# Patient Record
Sex: Male | Born: 2000
Health system: Southern US, Community
[De-identification: ages and names within clinical notes are randomized; demographics above are authoritative.]

## PROBLEM LIST (undated history)

## (undated) DIAGNOSIS — S8992XA Unspecified injury of left lower leg, initial encounter: Secondary | ICD-10-CM

## (undated) DIAGNOSIS — S93409A Sprain of unspecified ligament of unspecified ankle, initial encounter: Secondary | ICD-10-CM

## (undated) DIAGNOSIS — M9251 Juvenile osteochondrosis of tibia and fibula, right leg: Secondary | ICD-10-CM

## (undated) DIAGNOSIS — J101 Influenza due to other identified influenza virus with other respiratory manifestations: Secondary | ICD-10-CM

## (undated) DIAGNOSIS — Z8619 Personal history of other infectious and parasitic diseases: Secondary | ICD-10-CM

## (undated) DIAGNOSIS — Z8781 Personal history of (healed) traumatic fracture: Secondary | ICD-10-CM

## (undated) DIAGNOSIS — S83003A Unspecified subluxation of unspecified patella, initial encounter: Secondary | ICD-10-CM

## (undated) DIAGNOSIS — S060X9A Concussion with loss of consciousness of unspecified duration, initial encounter: Secondary | ICD-10-CM

## (undated) DIAGNOSIS — M92521 Juvenile osteochondrosis of tibia tubercle, right leg: Secondary | ICD-10-CM

## (undated) DIAGNOSIS — L300 Nummular dermatitis: Secondary | ICD-10-CM

## (undated) HISTORY — DX: Concussion with loss of consciousness of unspecified duration, initial encounter: S06.0X9A

## (undated) HISTORY — DX: Influenza due to other identified influenza virus with other respiratory manifestations: J10.1

## (undated) HISTORY — DX: Unspecified subluxation of unspecified patella, initial encounter: S83.003A

## (undated) HISTORY — DX: Nummular dermatitis: L30.0

## (undated) HISTORY — DX: Unspecified injury of left lower leg, initial encounter: S89.92XA

## (undated) HISTORY — DX: Personal history of (healed) traumatic fracture: Z87.81

## (undated) HISTORY — DX: Sprain of unspecified ligament of unspecified ankle, initial encounter: S93.409A

## (undated) HISTORY — DX: Personal history of other infectious and parasitic diseases: Z86.19

## (undated) HISTORY — DX: Rider (driver) (passenger) of other motorcycle injured in unspecified traffic accident, initial encounter: V29.99XA

---
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
## 2022-04-29 DIAGNOSIS — M25562 Pain in left knee: Secondary | ICD-10-CM | POA: Insufficient documentation

## 2022-04-30 ENCOUNTER — Ambulatory Visit: Payer: Commercial Managed Care - PPO | Admitting: Family Medicine

## 2022-05-01 ENCOUNTER — Encounter: Payer: Self-pay | Admitting: Family Medicine

## 2022-05-01 ENCOUNTER — Ambulatory Visit (INDEPENDENT_AMBULATORY_CARE_PROVIDER_SITE_OTHER): Payer: Commercial Managed Care - PPO | Admitting: Family Medicine

## 2022-05-01 VITALS — BP 110/74 | HR 91 | Temp 98.1°F | Ht 70.75 in | Wt 159.6 lb

## 2022-05-01 DIAGNOSIS — Z808 Family history of malignant neoplasm of other organs or systems: Secondary | ICD-10-CM

## 2022-05-01 DIAGNOSIS — R5383 Other fatigue: Secondary | ICD-10-CM | POA: Diagnosis not present

## 2022-05-01 DIAGNOSIS — F4323 Adjustment disorder with mixed anxiety and depressed mood: Secondary | ICD-10-CM | POA: Diagnosis not present

## 2022-05-01 LAB — CBC WITH DIFFERENTIAL/PLATELET
Basophils Absolute: 0 10*3/uL (ref 0.0–0.1)
Basophils Relative: 0.4 % (ref 0.0–3.0)
Eosinophils Absolute: 0.1 10*3/uL (ref 0.0–0.7)
Eosinophils Relative: 0.9 % (ref 0.0–5.0)
HCT: 44.8 % (ref 36.0–49.0)
Hemoglobin: 15.4 g/dL (ref 12.0–16.0)
Lymphocytes Relative: 14.4 % — ABNORMAL LOW (ref 24.0–48.0)
Lymphs Abs: 1.5 10*3/uL (ref 0.7–4.0)
MCHC: 34.2 g/dL (ref 31.0–37.0)
MCV: 90.3 fl (ref 78.0–98.0)
Monocytes Absolute: 1 10*3/uL (ref 0.1–1.0)
Monocytes Relative: 10 % (ref 3.0–12.0)
Neutro Abs: 7.7 10*3/uL (ref 1.4–7.7)
Neutrophils Relative %: 74.3 % — ABNORMAL HIGH (ref 43.0–71.0)
Platelets: 319 10*3/uL (ref 150.0–575.0)
RBC: 4.97 Mil/uL (ref 3.80–5.70)
RDW: 12.5 % (ref 11.4–15.5)
WBC: 10.4 10*3/uL (ref 4.5–13.5)

## 2022-05-01 LAB — COMPREHENSIVE METABOLIC PANEL
ALT: 10 U/L (ref 0–53)
AST: 16 U/L (ref 0–37)
Albumin: 4.6 g/dL (ref 3.5–5.2)
Alkaline Phosphatase: 74 U/L (ref 52–171)
BUN: 14 mg/dL (ref 6–23)
CO2: 29 mEq/L (ref 19–32)
Calcium: 10 mg/dL (ref 8.4–10.5)
Chloride: 99 mEq/L (ref 96–112)
Creatinine, Ser: 1.26 mg/dL (ref 0.40–1.50)
GFR: 83.02 mL/min (ref >60.00)
Glucose, Bld: 72 mg/dL (ref 70–99)
Potassium: 4.6 mEq/L (ref 3.5–5.1)
Sodium: 137 mEq/L (ref 135–145)
Total Bilirubin: 0.9 mg/dL (ref 0.3–1.2)
Total Protein: 7.6 g/dL (ref 6.0–8.3)

## 2022-05-01 LAB — PHOSPHORUS: Phosphorus: 3.9 mg/dL — ABNORMAL LOW (ref 4.5–5.5)

## 2022-05-01 LAB — TSH: TSH: 1.09 u[IU]/mL (ref 0.40–5.00)

## 2022-05-01 LAB — VITAMIN D 25 HYDROXY (VIT D DEFICIENCY, FRACTURES): VITD: 29.1 ng/mL — ABNORMAL LOW (ref 30.00–100.00)

## 2022-05-01 NOTE — Progress Notes (Signed)
OFFICE VISIT  05/02/2022  CC:  Chief Complaint  Patient presents with   Discuss family history    Wants to discuss family history of medical conditions; dad had cancer at 63 and paternal aunt developed cancer as well.    Patient is a 21 y.o. male who presents accompanied by his mom Celeste for concerns about cancer.  HPI: Bland Span has no acute medical complaints. He does have an acute left leg injury which he saw orthopedics for--he hyperextended his left knee when stepping off a ladder at work.  MRI was done today.  There is concern of a stress fracture and possibly ligament injury.  Bland Span is had a number of musculoskeletal injuries sustained in football and wrestling. All of these were with significant impact and or excessive mechanical stress on a bone or joint.  Bland Span has had some purposeful weight loss when trying to meet a specific weight when in wrestling. After these periods he is able to return to his base weight.  However after the most recent.  He feels like his appetite has not returned much.  He gets full easily.  He has no abdominal pain, though.  No nausea or vomiting. He seems to be tired more lately, less motivated to do things. All these things together, combined with family history of cancer brought up concerns that possibly Bland Span had cancer--- his father had thyroid cancer at age 58, his little sister died at a young age from glioblastoma multiforme.    I had a conversation today with Bland Span alone. He is struggling a lot with pressures of life, feels a bit lost and everything that he has to do and feels alone and like he is got no direction.  He admits to some periods of depression but they are mild and never lasting more than a day or 2.  He has good relationships with his friends although he does not see them as much due to being busy with school and work.  He has a good relationship with his mother and his brothers but says he does not really have a relationship with his  father. His biggest concern is that he does not feel motivated to do things like he used to.  He says this has affected his appetite as well. He is not using any alcohol or drugs.   Past Medical History:  Diagnosis Date   Acromioclavicular separation, type 1 01/2021   Concussion 06/2018 and 09/2018   Concussion clinic (Dr. Tamala Julian)   Grade 3 ankle sprain 04/2018   [facility]--Dr. Layne Benton.  +medial malleolus avulsion fracture with the sprain-->walking boot.   History of fracture of phalanx of finger    R   History of tinea corporis    pt is a wrestler   Influenza B 08/19/2018   [facility] in [place]   Nummular eczema 07/2018   Osgood-Schlatter's disease of right knee    Patellar subluxation    R   Tear of ulnar collateral ligament of right elbow 10/11/2020   (wrestling)->surgery (Dr. Caralyn Guile)    Past Surgical History:  Procedure Laterality Date   chest xray  05/29/2019   fl esophagram  05/29/2019    Outpatient Medications Prior to Visit  Medication Sig Dispense Refill   meloxicam (MOBIC) 15 MG tablet Take 15 mg by mouth daily.     cefdinir (OMNICEF) 300 MG capsule Take 1 capsule (300 mg total) by mouth 2 (two) times daily. (Patient not taking: Reported on 05/01/2022) 20 capsule 0  No facility-administered medications prior to visit.    Allergies  Allergen Reactions   Amoxicillin Rash    ROS As per HPI  PE:    05/01/2022   11:11 AM 12/25/2021    1:21 PM 09/14/2020    8:04 AM  Vitals with BMI  Height 5' 10.75" 5' 10.75"   Weight 159 lbs 10 oz 163 lbs 10 oz 158 lbs  BMI 60.63 01.60   Systolic 109 323   Diastolic 74 74   Pulse 91 114    Physical Exam  Gen: Alert, well appearing.  Patient is oriented to person, place, time, and situation. AFFECT: pleasant, lucid thought and speech. No further exam today.  LABS:  none  IMPRESSION AND PLAN:  #1 recurrent musculoskeletal injury. These have all been appropriate for the amount of trauma/impact  that caused them.  In other words, no worry about pathologic fracture. Check calcium, phosphorus, vitamin D, and alkaline phosphatase.  #2 family history of thyroid cancer in father and glioblastoma multiforme in sister. Check TSH today. Additionally, check CBC with differential and complete metabolic panel.  #3 adjustment disorder with mixed anxious and depressed mood. Gave emotional support, empathic listening today. We discussed medications for anxiety and depression and both of [place] felt like these were not imminently needed.  However I encouraged him to discuss this issue with his mother and we can reconvene to discuss medications or counseling as appropriate.  #4 fatigue and appetite loss. Suspect this is due to #3 above. Regarding his weight loss, it does seem that this has all been purposeful at different points in the past.  In review of his weights since he was 21 years old he has been 156 to 166 pounds, and is currently at 159-1/2 pounds.  Spent 30 min with pt today reviewing HPI, reviewing relevant past history, doing exam, reviewing and discussing lab and imaging data, and formulating plans.  An After Visit Summary was printed and given to the patient.  FOLLOW UP: Return for To be determined based on lab results.  Signed:  Crissie Sickles, MD           05/02/2022

## 2022-05-02 ENCOUNTER — Telehealth: Payer: Self-pay

## 2022-05-02 NOTE — Addendum Note (Signed)
Addended by: Emi Holes D on: 05/02/2022 11:47 AM   Modules accepted: Orders

## 2022-05-02 NOTE — Telephone Encounter (Signed)
Jeoffrey Massed, MD  05/02/2022  7:41 AM EDT Back to Top    Please cancel the alkaline phosphatase and basic metabolic panel that I ordered with these labs.   Both labs cancelled per provider request

## 2022-05-19 ENCOUNTER — Encounter: Payer: Self-pay | Admitting: Family Medicine

## 2022-05-21 LAB — CBC: RBC: 5.14 — AB (ref 3.87–5.11)

## 2022-05-21 LAB — CBC AND DIFFERENTIAL
HCT: 46 (ref 41–53)
Hemoglobin: 15.7 (ref 13.5–17.5)
Neutrophils Absolute: 6231
Platelets: 383 10*3/uL (ref 150–400)
WBC: 8.5

## 2022-05-21 LAB — COMPREHENSIVE METABOLIC PANEL
Albumin: 4.5 (ref 3.5–5.0)
Calcium: 9.9 (ref 8.7–10.7)
Globulin: 2.9
eGFR: 94

## 2022-05-21 LAB — POCT ERYTHROCYTE SEDIMENTATION RATE, NON-AUTOMATED: Sed Rate: 25

## 2022-05-21 LAB — HEPATIC FUNCTION PANEL
ALT: 10 U/L (ref 10–40)
AST: 14 (ref 14–40)
Alkaline Phosphatase: 78 (ref 25–125)
Bilirubin, Total: 0.7

## 2022-05-21 LAB — BASIC METABOLIC PANEL
BUN: 12 (ref 4–21)
CO2: 31 — AB (ref 13–22)
Chloride: 101 (ref 99–108)
Creatinine: 1.2 (ref 0.6–1.3)
Glucose: 110
Potassium: 4.5 mEq/L (ref 3.5–5.1)
Sodium: 138 (ref 137–147)

## 2022-05-21 LAB — VITAMIN D 25 HYDROXY (VIT D DEFICIENCY, FRACTURES): Vit D, 25-Hydroxy: 37

## 2022-05-21 LAB — TSH: TSH: 1.88 (ref 0.41–5.90)

## 2022-06-02 ENCOUNTER — Telehealth: Payer: Self-pay | Admitting: Family Medicine

## 2022-06-02 NOTE — Telephone Encounter (Signed)
Pt would like a phone call

## 2022-06-05 ENCOUNTER — Ambulatory Visit (INDEPENDENT_AMBULATORY_CARE_PROVIDER_SITE_OTHER): Payer: Commercial Managed Care - PPO | Admitting: Family Medicine

## 2022-06-05 ENCOUNTER — Encounter: Payer: Self-pay | Admitting: Family Medicine

## 2022-06-05 VITALS — BP 102/67 | HR 62 | Temp 98.1°F | Ht 70.75 in | Wt 159.0 lb

## 2022-06-05 DIAGNOSIS — M130 Polyarthritis, unspecified: Secondary | ICD-10-CM

## 2022-06-05 DIAGNOSIS — R5383 Other fatigue: Secondary | ICD-10-CM

## 2022-06-05 DIAGNOSIS — M25471 Effusion, right ankle: Secondary | ICD-10-CM | POA: Diagnosis not present

## 2022-06-05 DIAGNOSIS — M25472 Effusion, left ankle: Secondary | ICD-10-CM

## 2022-06-05 DIAGNOSIS — M25461 Effusion, right knee: Secondary | ICD-10-CM | POA: Diagnosis not present

## 2022-06-05 DIAGNOSIS — M25462 Effusion, left knee: Secondary | ICD-10-CM

## 2022-06-05 DIAGNOSIS — R700 Elevated erythrocyte sedimentation rate: Secondary | ICD-10-CM

## 2022-06-05 LAB — COMPREHENSIVE METABOLIC PANEL
ALT: 12 U/L (ref 0–53)
AST: 13 U/L (ref 0–37)
Albumin: 4.5 g/dL (ref 3.5–5.2)
Alkaline Phosphatase: 78 U/L (ref 52–171)
BUN: 13 mg/dL (ref 6–23)
CO2: 31 mEq/L (ref 19–32)
Calcium: 10.3 mg/dL (ref 8.4–10.5)
Chloride: 101 mEq/L (ref 96–112)
Creatinine, Ser: 1.08 mg/dL (ref 0.40–1.50)
GFR: 99.83 mL/min (ref >60.00)
Glucose, Bld: 62 mg/dL — ABNORMAL LOW (ref 70–99)
Potassium: 4.3 mEq/L (ref 3.5–5.1)
Sodium: 138 mEq/L (ref 135–145)
Total Bilirubin: 0.5 mg/dL (ref 0.2–1.2)
Total Protein: 8 g/dL (ref 6.0–8.3)

## 2022-06-05 LAB — CBC WITH DIFFERENTIAL/PLATELET
Basophils Absolute: 0.1 10*3/uL (ref 0.0–0.1)
Basophils Relative: 0.6 % (ref 0.0–3.0)
Eosinophils Absolute: 0 10*3/uL (ref 0.0–0.7)
Eosinophils Relative: 0.3 % (ref 0.0–5.0)
HCT: 43.2 % (ref 36.0–49.0)
Hemoglobin: 14.6 g/dL (ref 12.0–16.0)
Lymphocytes Relative: 13.6 % — ABNORMAL LOW (ref 24.0–48.0)
Lymphs Abs: 1.4 10*3/uL (ref 0.7–4.0)
MCHC: 33.8 g/dL (ref 31.0–37.0)
MCV: 90.6 fl (ref 78.0–98.0)
Monocytes Absolute: 0.8 10*3/uL (ref 0.1–1.0)
Monocytes Relative: 7.8 % (ref 3.0–12.0)
Neutro Abs: 8 10*3/uL — ABNORMAL HIGH (ref 1.4–7.7)
Neutrophils Relative %: 77.7 % — ABNORMAL HIGH (ref 43.0–71.0)
Platelets: 448 10*3/uL (ref 150.0–575.0)
RBC: 4.77 Mil/uL (ref 3.80–5.70)
RDW: 12.3 % (ref 11.4–15.5)
WBC: 10.3 10*3/uL (ref 4.5–13.5)

## 2022-06-05 LAB — MONONUCLEOSIS SCREEN: Mono Screen: NEGATIVE

## 2022-06-05 LAB — SEDIMENTATION RATE: Sed Rate: 50 mm/hr — ABNORMAL HIGH (ref 0–15)

## 2022-06-05 LAB — C-REACTIVE PROTEIN: CRP: 9.7 mg/dL (ref 0.5–20.0)

## 2022-06-05 MED ORDER — PREDNISONE 10 MG PO TABS: ORAL_TABLET | ORAL | 0 refills | Status: DC

## 2022-06-05 NOTE — Progress Notes (Signed)
OFFICE VISIT  06/05/2022  CC: joint swelling/pain  Patient is a 21 y.o. male who presents unaccompanied for joint concerns.  HPI: About 3 weeks ago he began having problems with his knees. Initially had a acute left knee injury at work and was being evaluated, MRI was done, I do not know the result but sounds like no acute tears/internal derangement.  This pain completely went away for a few days.  He then got spontaneous pain, swelling, and warmth in his right knee.  He was reevaluated by orthopedics and was found to have knee effusion and popliteal cyst.  These were aspirated.  I do not know if fluid was sent for analysis. He did have an extensive panel of blood tests around that time that I reviewed today, the only abnormality being a sed rate of 25 (CPK 83, uric acid 6.1 HLA-B27 negative, ANA negative, CCP negative, rheumatoid factor negative, ACE 24, TSH 1.88.  CBC and c-Met normal). We are not sure if he was put on any medication after this.  The right knee improved significantly but then within the last week his left knee has begun to swell, feel warm, and hurt. In addition both ankles hurt and feel swollen. He does feel moderately fatigued.  He does feel like he has had low-grade fevers sometimes at night. He hurts in the muscles of his thighs.  Right elbow hurting some lately--- he has had an ulnar collateral ligament tear and repair on this side.  Right elbow not swollen.  Denies headaches, neck stiffness or pain, back pain, hips pain, shoulders pain, or pain in the hands. No recent viral illness. No recent strep throat illness but he does have a history of recurrent strep throat through his teen years.  No known tick bite.  No known rash.  No GI symptoms.   Past Medical History:  Diagnosis Date   Acromioclavicular separation, type 1 01/2021   Concussion 06/2018 and 09/2018   Concussion clinic (Dr. Tamala Julian)   Grade 3 ankle sprain 04/2018   [facility]--Dr. Layne Benton.  +medial  malleolus avulsion fracture with the sprain-->walking boot.   History of fracture of phalanx of finger    R   History of tinea corporis    pt is a wrestler   Influenza B 08/19/2018   [place] [facility]   Left knee injury    effusion and popliteal cyst on MRI ([facility])->aspirated and injected 05/15/22   Nummular eczema 07/2018   Osgood-Schlatter's disease of right knee    Patellar subluxation    R   Tear of ulnar collateral ligament of right elbow 10/11/2020   (wrestling)->surgery (Dr. Caralyn Guile)    Past Surgical History:  Procedure Laterality Date   chest xray  05/29/2019   fl esophagram  05/29/2019    Outpatient Medications Prior to Visit  Medication Sig Dispense Refill   meloxicam (MOBIC) 15 MG tablet Take 15 mg by mouth daily.     No facility-administered medications prior to visit.    Allergies  Allergen Reactions   Amoxicillin Rash    ROS As per HPI  PE:    06/05/2022   11:03 AM 05/01/2022   11:11 AM 12/25/2021    1:21 PM  Vitals with BMI  Height 5' 10.75" 5' 10.75" 5' 10.75"  Weight 159 lbs 159 lbs 10 oz 163 lbs 10 oz  BMI 22.33 16.94 50.38  Systolic 882 800 349  Diastolic 67 74 74  Pulse 62 91 114  Physical Exam  Neuro: Alert and well-appearing. Cardiovascular: Regular rhythm and rate without murmur. Lungs are clear bilaterally, nonlabored breathing. No swelling, erythema, or warmth of the elbows, wrists, or hands. Right knee with minimal discomfort to palpation, no warmth, no erythema, visible effusion.  Popliteal area feels normal. Left knee with visible effusion, warmth, and moderate tenderness to palpation diffusely. He can extend the knee to a point just shy of full extension.  Flexion is fully intact. Ankles without obvious effusion or periarticular swelling on inspection.  No erythema or warmth.  Range of motion intact.  LABS:  Last CBC Lab Results  Component Value Date   WBC 10.4 05/01/2022   HGB 15.4 05/01/2022    HCT 44.8 05/01/2022   MCV 90.3 05/01/2022   RDW 12.5 05/01/2022   PLT 319.0 67/67/2094   Last metabolic panel Lab Results  Component Value Date   GLUCOSE 72 05/01/2022   NA 137 05/01/2022   K 4.6 05/01/2022   CL 99 05/01/2022   CO2 29 05/01/2022   BUN 14 05/01/2022   CREATININE 1.26 05/01/2022   CALCIUM 10.0 05/01/2022   PHOS 3.9 (L) 05/01/2022   PROT 7.6 05/01/2022   ALBUMIN 4.6 05/01/2022   BILITOT 0.9 05/01/2022   ALKPHOS 74 05/01/2022   AST 16 05/01/2022   ALT 10 05/01/2022   Last thyroid functions Lab Results  Component Value Date   TSH 1.09 05/01/2022   Last vitamin D Lab Results  Component Value Date   VD25OH 29.10 (L) 05/01/2022   IMPRESSION AND PLAN:  Polyarthritis. Unknown etiology.  Recent rheumatologic laboratory work at his orthopedist was unrevealing except sed rate of 25. We will have to check with their office to see if they did any joint fluid analysis from his right knee aspiration. I did blood work today-->Lyme serology with reflex Western blot, ASO titer, CRP, and repeat of his CBC, c-Met and sed rate. I did start prednisone today: 30 mg a day x3 days, 20 mg a day x3 days, then 10 mg a day x3 days. Discussed possible aspiration of left knee today but since he is not in all that much discomfort with it we chose to hold off.  We can always have him come back if we need to aspirate for synovial fluid analysis.  He does have a rheumatology initial visit set for sometime in November.  He did not have details of exact date or who he is seeing.  (Bedside limited [facility] ultrasound today: Left knee with moderate effusion in suprapatellar pouch as well as along medial and lateral peripatellar joint spaces.  Anechoic changes surrounding popliteal tendon.  Medial and lateral menisci normal.  Quad and patellar tendons appear normal.  There is some hyperemia in suprapatellar pouch region indicating possible synovitis. Popliteal region with anechoic changes in  semimembranosus-medial gastroc bursa with indistinct borders, suggesting possible recent rupture.  Right knee with just a small suprapatellar effusion.  No hyperemia.  Both ankles show small amount of anechoic distention of the intra-articular fat of the tibiotalar joint.)  Spent 50 min with pt today reviewing HPI, specialist notes and labs, reviewing relevant past history, doing exam, performing bedside ultrasound, discussing further evaluation and treatment options, and formulating plans.  An After Visit Summary was printed and given to the patient.  FOLLOW UP: Return in about 1 week (around 06/12/2022) for Follow-up arthritis.  Signed:  Crissie Sickles, MD           06/05/2022

## 2022-06-06 LAB — LYME DISEASE SEROLOGY W/REFLEX: Lyme Total Antibody EIA: NEGATIVE

## 2022-06-06 LAB — ANTISTREPTOLYSIN O TITER: ASO: 113 IU/mL (ref <250)

## 2022-06-09 ENCOUNTER — Telehealth: Payer: Self-pay

## 2022-06-09 NOTE — Telephone Encounter (Signed)
Please advise if call back can be completed.

## 2022-06-09 NOTE — Telephone Encounter (Signed)
Patient Mom, [facility]) would like to discuss test results, sed rate, with Dr. Anitra Lauth.  She states she does not think Bland Span is taking prednisone.  With all labs recently, she is wondering what Dr. Anitra Lauth is trying to "rule out". She is requesting quick chat with him after hours is okay. [phone]

## 2022-06-09 NOTE — Telephone Encounter (Signed)
Called patient and left message on his voicemail asking him to call back to discuss results.  Called and talked to his mother. She is not sure if he started the prednisone. She is going to talk with Bland Span and see if he will come back in for aspiration of his knee and analysis of the fluid.

## 2022-06-23 ENCOUNTER — Emergency Department (HOSPITAL_BASED_OUTPATIENT_CLINIC_OR_DEPARTMENT_OTHER)
Admission: EM | Admit: 2022-06-23 | Discharge: 2022-06-24 | Disposition: A | Discharge status: Home / Self Care | Payer: Commercial Managed Care - PPO | Attending: Emergency Medicine | Admitting: Emergency Medicine

## 2022-06-23 ENCOUNTER — Other Ambulatory Visit: Payer: Self-pay

## 2022-06-23 ENCOUNTER — Encounter (HOSPITAL_BASED_OUTPATIENT_CLINIC_OR_DEPARTMENT_OTHER): Payer: Self-pay | Admitting: Emergency Medicine

## 2022-06-23 DIAGNOSIS — B349 Viral infection, unspecified: Secondary | ICD-10-CM | POA: Diagnosis not present

## 2022-06-23 DIAGNOSIS — Z20822 Contact with and (suspected) exposure to covid-19: Secondary | ICD-10-CM | POA: Insufficient documentation

## 2022-06-23 DIAGNOSIS — D72829 Elevated white blood cell count, unspecified: Secondary | ICD-10-CM | POA: Insufficient documentation

## 2022-06-23 DIAGNOSIS — R509 Fever, unspecified: Secondary | ICD-10-CM | POA: Diagnosis present

## 2022-06-23 NOTE — ED Triage Notes (Signed)
Patient arrived via POV c/o fever w/ sore throat x 3 days. Patient states headache w/ light sensitivity. Patient denies being around anyone else sick. Patient is AO x 4, VS w/ fever, slow gait.

## 2022-06-24 ENCOUNTER — Encounter (HOSPITAL_BASED_OUTPATIENT_CLINIC_OR_DEPARTMENT_OTHER): Payer: Self-pay | Admitting: Emergency Medicine

## 2022-06-24 LAB — CBC WITH DIFFERENTIAL/PLATELET
Abs Immature Granulocytes: 0.06 10*3/uL (ref 0.00–0.07)
Basophils Absolute: 0 10*3/uL (ref 0.0–0.1)
Basophils Relative: 0 %
Eosinophils Absolute: 0 10*3/uL (ref 0.0–0.5)
Eosinophils Relative: 0 %
HCT: 41.5 % (ref 39.0–52.0)
Hemoglobin: 14.2 g/dL (ref 13.0–17.0)
Immature Granulocytes: 0 %
Lymphocytes Relative: 4 %
Lymphs Abs: 0.7 10*3/uL (ref 0.7–4.0)
MCH: 30.3 pg (ref 26.0–34.0)
MCHC: 34.2 g/dL (ref 30.0–36.0)
MCV: 88.5 fL (ref 80.0–100.0)
Monocytes Absolute: 0.8 10*3/uL (ref 0.1–1.0)
Monocytes Relative: 5 %
Neutro Abs: 15.5 10*3/uL — ABNORMAL HIGH (ref 1.7–7.7)
Neutrophils Relative %: 91 %
Platelets: 274 10*3/uL (ref 150–400)
RBC: 4.69 MIL/uL (ref 4.22–5.81)
RDW: 12.5 % (ref 11.5–15.5)
WBC: 17.1 10*3/uL — ABNORMAL HIGH (ref 4.0–10.5)
nRBC: 0 % (ref 0.0–0.2)

## 2022-06-24 LAB — COMPREHENSIVE METABOLIC PANEL
ALT: 12 U/L (ref 0–44)
AST: 18 U/L (ref 15–41)
Albumin: 4 g/dL (ref 3.5–5.0)
Alkaline Phosphatase: 66 U/L (ref 38–126)
Anion gap: 8 (ref 5–15)
BUN: 11 mg/dL (ref 6–20)
CO2: 23 mmol/L (ref 22–32)
Calcium: 8.9 mg/dL (ref 8.9–10.3)
Chloride: 102 mmol/L (ref 98–111)
Creatinine, Ser: 1.07 mg/dL (ref 0.61–1.24)
GFR, Estimated: >60 mL/min (ref >60)
Glucose, Bld: 101 mg/dL — ABNORMAL HIGH (ref 70–99)
Potassium: 3.4 mmol/L — ABNORMAL LOW (ref 3.5–5.1)
Sodium: 133 mmol/L — ABNORMAL LOW (ref 135–145)
Total Bilirubin: 1.1 mg/dL (ref 0.3–1.2)
Total Protein: 7.5 g/dL (ref 6.5–8.1)

## 2022-06-24 LAB — MONONUCLEOSIS SCREEN: Mono Screen: NEGATIVE

## 2022-06-24 LAB — RESP PANEL BY RT-PCR (FLU A&B, COVID) ARPGX2
Influenza A by PCR: NEGATIVE
Influenza B by PCR: NEGATIVE
SARS Coronavirus 2 by RT PCR: NEGATIVE

## 2022-06-24 LAB — GROUP A STREP BY PCR: Group A Strep by PCR: NOT DETECTED

## 2022-06-24 MED ORDER — DIPHENHYDRAMINE HCL 50 MG/ML IJ SOLN
25.0000 mg | Freq: Once | INTRAMUSCULAR | Status: AC
  Administered 2022-06-24: 25 mg via INTRAVENOUS
  Filled 2022-06-24: qty 1

## 2022-06-24 MED ORDER — METOCLOPRAMIDE HCL 5 MG/ML IJ SOLN
5.0000 mg | Freq: Once | INTRAMUSCULAR | Status: AC
  Administered 2022-06-24: 5 mg via INTRAVENOUS
  Filled 2022-06-24: qty 2

## 2022-06-24 MED ORDER — ACETAMINOPHEN 325 MG PO TABS: 650.0000 mg | ORAL_TABLET | Freq: Four times a day (QID) | ORAL | 0 refills | Status: DC | PRN

## 2022-06-24 MED ORDER — LIDOCAINE 5 % EX PTCH
1.0000 | MEDICATED_PATCH | CUTANEOUS | Status: DC
  Administered 2022-06-24: 1 via TRANSDERMAL
  Filled 2022-06-24: qty 1

## 2022-06-24 MED ORDER — METOCLOPRAMIDE HCL 10 MG PO TABS: 5.0000 mg | ORAL_TABLET | Freq: Once | ORAL | Status: DC

## 2022-06-24 MED ORDER — DIPHENHYDRAMINE HCL 25 MG PO CAPS: 25.0000 mg | ORAL_CAPSULE | Freq: Once | ORAL | Status: DC

## 2022-06-24 MED ORDER — LIDOCAINE VISCOUS HCL 2 % MT SOLN: 15.0000 mL | Freq: Once | OROMUCOSAL | Status: DC

## 2022-06-24 MED ORDER — IBUPROFEN 600 MG PO TABS: 600.0000 mg | ORAL_TABLET | Freq: Four times a day (QID) | ORAL | 0 refills | Status: DC | PRN

## 2022-06-24 MED ORDER — IBUPROFEN 800 MG PO TABS
800.0000 mg | ORAL_TABLET | Freq: Once | ORAL | Status: AC
  Administered 2022-06-24: 800 mg via ORAL
  Filled 2022-06-24: qty 1

## 2022-06-24 MED ORDER — SODIUM CHLORIDE 0.9 % IV BOLUS
1000.0000 mL | Freq: Once | INTRAVENOUS | Status: AC
  Administered 2022-06-24: 1000 mL via INTRAVENOUS

## 2022-06-24 MED ORDER — METOCLOPRAMIDE HCL 5 MG PO TABS: 5.0000 mg | ORAL_TABLET | Freq: Four times a day (QID) | ORAL | 0 refills | Status: DC | PRN

## 2022-06-24 MED ORDER — MENTHOL 3 MG MT LOZG: 1.0000 | LOZENGE | OROMUCOSAL | 0 refills | Status: DC | PRN

## 2022-06-24 MED ORDER — KETOROLAC TROMETHAMINE 15 MG/ML IJ SOLN
15.0000 mg | Freq: Once | INTRAMUSCULAR | Status: AC
  Administered 2022-06-24: 15 mg via INTRAVENOUS
  Filled 2022-06-24: qty 1

## 2022-06-24 NOTE — ED Provider Notes (Signed)
[facility] EMERGENCY DEPARTMENT Provider Note   CSN: [id number] Arrival date & time: 06/23/22  2302     History  Chief Complaint  Patient presents with   Fever    William Paul is a 21 y.o. male.  Patient as above with significant medical history as below, including osgood schlatter, prior concussion, msk injuries as listed below who presents to the ED with complaint of headache, sore throat, fever.  Patient ports sore throat for the past 2 to 3 days, been having headache this evening.  Fevers and chills last few hours, mother took his temperature at home with ear thermometer temperature was 102.  She gave him 1000mg  of Tylenol around 1130 12 AM.  Patient reports headache is primarily behind his eyes on both sides, he has some light sensitivity, he has body aches, no  chills, no numbness or tingling.  No neck stiffness, no rashes, no IV drug use, no recent travel or sick contacts.  Headache described as sharp, pulsatile.     Past Medical History:  Diagnosis Date   Acromioclavicular separation, type 1 01/2021   Concussion 06/2018 and 09/2018   Concussion clinic (Dr. 10/2018)   Grade 3 ankle sprain 04/2018   [facility]--Dr. 05/2018.  +medial malleolus avulsion fracture with the sprain-->walking boot.   History of fracture of phalanx of finger    R   History of tinea corporis    pt is a wrestler   Influenza B 08/19/2018   [facility]   Left knee injury    effusion and popliteal cyst on MRI (em[facility])->aspirated and injected 05/15/22   Nummular eczema 07/2018   Osgood-Schlatter's disease of right knee    Patellar subluxation    R   Tear of ulnar collateral ligament of right elbow 10/11/2020   (wrestling)->surgery (Dr. 12/09/2020)    Past Surgical History:  Procedure Laterality Date   chest xray  05/29/2019   fl esophagram  05/29/2019      The history is provided by the patient. No language interpreter was used.  Fever Associated  symptoms: chills, headaches and sore throat   Associated symptoms: no chest pain, no confusion, no cough, no nausea, no rash and no vomiting        Home Medications Prior to Admission medications   Medication Sig Start Date End Date Taking? Authorizing Provider  acetaminophen (TYLENOL) 325 MG tablet Take 2 tablets (650 mg total) by mouth every 6 (six) hours as needed. 06/24/22  Yes 06/26/22 A, DO  ibuprofen (ADVIL) 600 MG tablet Take 1 tablet (600 mg total) by mouth every 6 (six) hours as needed. 06/24/22  Yes 06/26/22, DO  menthol-cetylpyridinium (CEPACOL) 3 MG lozenge Take 1 lozenge (3 mg total) by mouth as needed for sore throat. 06/24/22  Yes 06/26/22, DO  metoCLOPramide (REGLAN) 5 MG tablet Take 1 tablet (5 mg total) by mouth every 6 (six) hours as needed (headache). 06/24/22  Yes 06/26/22 A, DO  meloxicam (MOBIC) 15 MG tablet Take 15 mg by mouth daily. 04/29/22   [provider]  predniSONE (DELTASONE) 10 MG tablet 3 tabs po qd x 3d, then 2 tabs po qd x 3d, then 1 tab po qd x 3d 06/05/22   McGowen, 08/05/22, MD      Allergies    Amoxicillin    Review of Systems   Review of Systems  Constitutional:  Positive for chills and fever.  HENT:  Positive for sore  throat. Negative for facial swelling and trouble swallowing.   Eyes:  Negative for photophobia and visual disturbance.  Respiratory:  Negative for cough and shortness of breath.   Cardiovascular:  Negative for chest pain and palpitations.  Gastrointestinal:  Negative for abdominal pain, nausea and vomiting.  Endocrine: Negative for polydipsia and polyuria.  Genitourinary:  Negative for difficulty urinating and hematuria.  Musculoskeletal:  Positive for arthralgias. Negative for gait problem and joint swelling.  Skin:  Negative for pallor and rash.  Neurological:  Positive for headaches. Negative for syncope.  Psychiatric/Behavioral:  Negative for agitation and confusion.     Physical Exam Updated  Vital Signs BP 128/71 (BP Location: Left Arm)   Pulse 79   Temp 98.5 F (36.9 C) (Oral)   Resp 20   Ht 5\' 11"  (1.803 m)   Wt 72.6 kg   SpO2 98%   BMI 22.32 kg/m  Physical Exam Vitals and nursing note reviewed.  Constitutional:      General: He is not in acute distress.    Appearance: Normal appearance. He is well-developed. He is not ill-appearing, toxic-appearing or diaphoretic.  HENT:     Head: Normocephalic and atraumatic.     Right Ear: External ear normal.     Left Ear: External ear normal.     Mouth/Throat:     Mouth: Mucous membranes are moist.     Tongue: Tongue does not deviate from midline.     Pharynx: Uvula midline. Pharyngeal swelling present. No uvula swelling.     Tonsils: No tonsillar exudate or tonsillar abscesses.     Comments: No drooling stridor or trismus Eyes:     General: No scleral icterus.       Right eye: No discharge.        Left eye: No discharge.     Extraocular Movements: Extraocular movements intact.     Pupils: Pupils are equal, round, and reactive to light.  Neck:     Trachea: Trachea normal.     Meningeal: Brudzinski's sign and Kernig's sign absent.  Cardiovascular:     Rate and Rhythm: Normal rate and regular rhythm.     Pulses: Normal pulses.     Heart sounds: Normal heart sounds.  Pulmonary:     Effort: Pulmonary effort is normal. No respiratory distress.     Breath sounds: Normal breath sounds.  Abdominal:     General: Abdomen is flat.     Palpations: Abdomen is soft.     Tenderness: There is no abdominal tenderness. There is no guarding or rebound.  Musculoskeletal:        General: Normal range of motion.     Cervical back: Full passive range of motion without pain and normal range of motion. No rigidity. No pain with movement. Normal range of motion.     Right lower leg: No edema.     Left lower leg: No edema.  Skin:    General: Skin is warm and dry.     Capillary Refill: Capillary refill takes less than 2 seconds.   Neurological:     Mental Status: He is alert and oriented to person, place, and time.     GCS: GCS eye subscore is 4. GCS verbal subscore is 5. GCS motor subscore is 6.     Cranial Nerves: Cranial nerves 2-12 are intact. No dysarthria or facial asymmetry.     Sensory: Sensation is intact.     Motor: Motor function is intact. No weakness.  Coordination: Coordination is intact.     Gait: Gait is intact.  Psychiatric:        Mood and Affect: Mood normal.        Behavior: Behavior normal.     ED Results / Procedures / Treatments   Labs (all labs ordered are listed, but only abnormal results are displayed) Labs Reviewed  CBC WITH DIFFERENTIAL/PLATELET - Abnormal; Notable for the following components:      Result Value   WBC 17.1 (*)    Neutro Abs 15.5 (*)    All other components within normal limits  COMPREHENSIVE METABOLIC PANEL - Abnormal; Notable for the following components:   Sodium 133 (*)    Potassium 3.4 (*)    Glucose, Bld 101 (*)    All other components within normal limits  GROUP A STREP BY PCR  RESP PANEL BY RT-PCR (FLU A&B, COVID) [id number]  SARS CORONAVIRUS 2 BY RT PCR  MONONUCLEOSIS SCREEN    EKG None  Radiology No results found.  Procedures Procedures    Medications Ordered in ED Medications  lidocaine (LIDODERM) 5 % 1 patch (1 patch Transdermal Patch Applied 06/24/22 0135)  lidocaine (XYLOCAINE) 2 % viscous mouth solution 15 mL (0 mLs Mouth/Throat Hold 06/24/22 0130)  ibuprofen (ADVIL) tablet 800 mg (800 mg Oral Given 06/24/22 0142)  sodium chloride 0.9 % bolus 1,000 mL (0 mLs Intravenous Stopped 06/24/22 0219)  metoCLOPramide (REGLAN) injection 5 mg (5 mg Intravenous Given 06/24/22 0137)  diphenhydrAMINE (BENADRYL) injection 25 mg (25 mg Intravenous Given 06/24/22 0137)  ketorolac (TORADOL) 15 MG/ML injection 15 mg (15 mg Intravenous Given 06/24/22 0234)    ED Course/ Medical Decision Making/ A&P Clinical Course as of 06/24/22 0325  Tue Jun 24, 2022  0058 Pt's mother came out into nursing area, reports that patient would now like iv/iv medications; will adjust orders  [SG]    Clinical Course User Index [SG] Sloan Leiter, DO                           Medical Decision Making Amount and/or Complexity of Data Reviewed Labs: ordered.  Risk OTC drugs. Prescription drug management.   This patient presents to the ED with chief complaint(s) of headache, sore throat with pertinent past medical history of above which further complicates the presenting complaint. The complaint involves an extensive differential diagnosis and also carries with it a high risk of complications and morbidity.    Differential diagnosis includes but is not exclusive to subarachnoid hemorrhage, meningitis, encephalitis, previous head trauma, cavernous venous thrombosis, muscle tension headache, glaucoma, temporal arteritis, migraine or migraine equivalent, strep, COVID, influenza, mono, viral syndrome etc.  . Serious etiologies were considered.   The initial plan is to RVP, strep in triage >> I discussed obtaining labs, IV medications with the patient; he is refusing any IV medications, IV fluids, or labs at this time.  Will trial p.o. medications.  Exam is reassuring.  AOx3, GCS 15, no focal neurologic deficits.  HDS.   Additional history obtained: Additional history obtained from family Records reviewed Primary Care Documents home medications, prior labs and imaging  Independent labs interpretation:  The following labs were independently interpreted:  Strep negative, RVP negative, mono negative CMP stable, CBC with leukocytosis 17.1; patient was febrile prior to arrival, vital signs stable otherwise.  Nontoxic in appearance  Independent visualization of imaging: N/a  Pulse oximetry  Treatment and Reassessment: Reglan, Benadryl, lidocaine, viscous lidocaine,ivf, toradol >>  symptoms greatly improved, pt sitting at edge of bed asking to go home    Consultation: - Consulted or discussed management/test interpretation w/ external professional: na  Consideration for admission or further workup: Admission was considered   Patient with likely viral syndrome, RVP, strep and mono were all negative today.  I have low suspicion for meningitis, he has no meningismus, no nuchal rigidity, no kernig/brudzinski, neurologically is intact, he is speaking clearly in full sentences, not altered, he is not septic or toxic in appearance.  He has no rashes.  Again no meningismus.  No recent travel or sick contacts. Does have leukocytosis, favor this is likely due to a viral infection.  Symptoms have greatly improved following intervention in the ED.  Discussed very strict return precautions with patient and family (mother)  The patient improved significantly and was discharged in stable condition. Detailed discussions were had with the patient regarding current findings, and need for close f/u with PCP or on call doctor. The patient has been instructed to return immediately if the symptoms worsen in any way for re-evaluation. Patient verbalized understanding and is in agreement with current care plan. All questions answered prior to discharge. Stressed importance of pcp follow up. Advised to RTED if symptoms worsen in any way or worrisome symptoms develop of if he does not improve over the next few days.     Social Determinants of health: Social History   Tobacco Use   Smoking status: Never   Smokeless tobacco: Never  Vaping Use   Vaping Use: Never used  Substance Use Topics   Alcohol use: No   Drug use: No            Final Clinical Impression(s) / ED Diagnoses Final diagnoses:  Viral illness    Rx / DC Orders ED Discharge Orders          Ordered    metoCLOPramide (REGLAN) 5 MG tablet  Every 6 hours PRN        06/24/22 0314    acetaminophen (TYLENOL) 325 MG tablet  Every 6 hours PRN        06/24/22 0314    ibuprofen (ADVIL) 600 MG  tablet  Every 6 hours PRN        06/24/22 0314    menthol-cetylpyridinium (CEPACOL) 3 MG lozenge  As needed        06/24/22 0316              Jeanell Sparrow, DO 06/24/22 [phone]

## 2022-06-24 NOTE — Discharge Instructions (Addendum)
It was a pleasure caring for you today in the emergency department.  Recommend lots of fluids and rest over the next few days  If your symptoms worsen please return to the ED, specifically if you pass out, start acting abnormally/speaking abnormally, develop a rash, headache becomes severe, difficulty turning/moving your neck, any worsening or worrisome symptoms

## 2022-06-25 ENCOUNTER — Telehealth: Payer: Self-pay

## 2022-06-25 ENCOUNTER — Ambulatory Visit (INDEPENDENT_AMBULATORY_CARE_PROVIDER_SITE_OTHER): Payer: Commercial Managed Care - PPO | Admitting: Family Medicine

## 2022-06-25 ENCOUNTER — Encounter: Payer: Self-pay | Admitting: Family Medicine

## 2022-06-25 ENCOUNTER — Telehealth: Payer: Self-pay | Admitting: Family Medicine

## 2022-06-25 VITALS — BP 147/80 | HR 79 | Temp 98.2°F | Wt 159.4 lb

## 2022-06-25 DIAGNOSIS — B9689 Other specified bacterial agents as the cause of diseases classified elsewhere: Secondary | ICD-10-CM

## 2022-06-25 DIAGNOSIS — J069 Acute upper respiratory infection, unspecified: Secondary | ICD-10-CM

## 2022-06-25 DIAGNOSIS — J029 Acute pharyngitis, unspecified: Secondary | ICD-10-CM | POA: Diagnosis not present

## 2022-06-25 LAB — POCT RAPID STREP A (OFFICE): Rapid Strep A Screen: NEGATIVE

## 2022-06-25 MED ORDER — DOXYCYCLINE HYCLATE 100 MG PO TABS: 100.0000 mg | ORAL_TABLET | Freq: Two times a day (BID) | ORAL | 0 refills | Status: DC

## 2022-06-25 NOTE — Progress Notes (Signed)
William Paul , May 15, 2001, 21 y.o., male MRN: [id number] Patient Care Team    Relationship Specialty Notifications Start End  McGowen, Maryjean Morn, MD PCP - General Family Medicine  03/14/15   Judi Saa, DO Consulting Physician Sports Medicine  06/23/18   Cherie Dark, PA Physician Assistant Orthopedic Surgery  11/14/19    Comment: Tivis Ringer, MD Consulting Physician Orthopedic Surgery  10/16/20   Arlyce Harman, MD Consulting Physician Sports Medicine  05/19/22     Chief Complaint  Patient presents with   Sore Throat    Started Saturday negative rapid and COVID on 10/23     Subjective: Pt presents for an OV with complaints of continued sore throat.  Patient reports she was seen in the urgent care a few days ago with pharyngitis.  He reports his fever is improving with Tylenol use but is still present.  He denies nausea, vomiting or diarrhea.  He is tolerating p.o. Reviewed labs that showed mild hyponatremia and mild low potassium in ED.  He has been hydrating with electrolyte drinks. His white blood cell count was significantly elevated at 17.1 with neutrophils 15.5. Negative monoscreen, negative group a strep by PCR, negative influenza, negative COVID, again and negative mono on 10/5. Patient states he appreciates white spots on the back of his tonsils now and he has had strep throat multiple times over the last few years that have been culture proven.     05/01/2022   11:15 AM 12/25/2021    1:27 PM  Depression screen PHQ 2/9  Decreased Interest 1 0  Down, Depressed, Hopeless 0 0  PHQ - 2 Score 1 0    Allergies  Allergen Reactions   Amoxicillin Rash   Social History   Social History Narrative   Parents divorced, lives part time with each.   Has 3 brothers.   Home-schooled.   No tobacco exposure.   Past Medical History:  Diagnosis Date   Acromioclavicular separation, type 1 01/2021   Concussion 06/2018 and 09/2018   Concussion clinic  (Dr. Katrinka Blazing)   Grade 3 ankle sprain 04/2018   [facility]--Dr. Cleophas Dunker.  +medial malleolus avulsion fracture with the sprain-->walking boot.   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 Melvyn Novas)   Past Surgical History:  Procedure Laterality Date   chest xray  05/29/2019   fl esophagram  05/29/2019   Family History  Problem Relation Age of Onset   Thyroid cancer Father 32   Brain cancer Sister    Mental illness Paternal Uncle    Mental illness Paternal Grandmother    Allergies as of 06/25/2022       Reactions   Amoxicillin Rash        Medication List        Accurate as of June 25, 2022  4:57 PM. If you have any questions, ask your nurse or doctor.          acetaminophen 325 MG tablet Commonly known as: Tylenol  Take 2 tablets (650 mg total) by mouth every 6 (six) hours as needed.   doxycycline 100 MG tablet Commonly known as: VIBRA-TABS Take 1 tablet (100 mg total) by mouth 2 (two) times daily. Started by: Howard Pouch, DO   ibuprofen 600 MG tablet Commonly known as: ADVIL Take 1 tablet (600 mg total) by mouth every 6 (six) hours as needed.   meloxicam 15 MG tablet Commonly known as: MOBIC Take 15 mg by mouth daily.   menthol-cetylpyridinium 3 MG lozenge Commonly known as: CEPACOL Take 1 lozenge (3 mg total) by mouth as needed for sore throat.   metoCLOPramide 5 MG tablet Commonly known as: REGLAN Take 1 tablet (5 mg total) by mouth every 6 (six) hours as needed (headache).   predniSONE 10 MG tablet Commonly known as: DELTASONE 3 tabs po qd x  3d, then 2 tabs po qd x 3d, then 1 tab po qd x 3d        All past medical history, surgical history, allergies, family history, immunizations andmedications were updated in the EMR today and reviewed under the history and medication portions of their EMR.     ROS Negative, with the exception of above mentioned in HPI   Objective:  BP (!) 147/80   Pulse 79   Temp 98.2 F (36.8 C)   Wt 159 lb 6.4 oz (72.3 kg)   SpO2 99%   BMI 22.23 kg/m  Body mass index is 22.23 kg/m. Physical Exam Vitals and nursing note reviewed.  Constitutional:      General: He is not in acute distress.    Appearance: Normal appearance. He is not ill-appearing, toxic-appearing or diaphoretic.  HENT:     Head: Normocephalic and atraumatic.     Right Ear: Tympanic membrane, ear canal and external ear normal. There is no impacted cerumen.     Left Ear: Tympanic membrane, ear canal and external ear normal. There is no impacted cerumen.     Nose: Nose normal. No congestion or rhinorrhea.     Mouth/Throat:     Mouth: Mucous membranes are moist.     Pharynx: Posterior oropharyngeal erythema present.     Tonsils: Tonsillar exudate present. No tonsillar abscesses. 1+ on the right. 1+ on the left.  Eyes:     General: No scleral icterus.       Right eye: No discharge.        Left eye: No discharge.     Extraocular Movements: Extraocular movements intact.     Pupils: Pupils are equal, round, and reactive to light.  Cardiovascular:     Rate and Rhythm: Normal rate and regular rhythm.     Heart sounds: No murmur heard. Pulmonary:     Effort: Pulmonary effort is normal.     Breath sounds: Normal breath sounds. No wheezing or rales.  Musculoskeletal:     Cervical back: Normal range of motion and neck supple. Tenderness present. No rigidity.  Lymphadenopathy:     Cervical: Cervical adenopathy present.  Skin:    General: Skin is warm and dry.     Coloration: Skin is not jaundiced or pale.     Findings: No  rash.  Neurological:     Mental Status: He is alert and oriented to person, place, and time. Mental status is at baseline.  Psychiatric:        Mood and Affect: Mood normal.        Behavior: Behavior normal.        Thought  Content: Thought content normal.        Judgment: Judgment normal.      No results found. No results found. Results for orders placed or performed in visit on 06/25/22 (from the past 24 hour(s))  POCT rapid strep A     Status: None   Collection Time: 06/25/22  3:47 PM  Result Value Ref Range   Rapid Strep A Screen Negative Negative    Assessment/Plan: Lelend Heinecke is a 21 y.o. male present for OV for  Pharyngitis, unspecified etiology/bacterial respiratory infection Symptoms have been present for greater than 7 days, with worsening sore throat and fever.  Fever is responding to Tylenol.  He is tolerating p.o.  I do appreciate some white exudates on the back of his right tonsil today. Elect to treat with doxycycline twice daily x10 days Patient encouraged to gargle with salt water nightly - POCT rapid strep A-negative - Upper Respiratory Culture sent respiratory throat culture for further evaluation Follow-up in 2 weeks with PCP if symptoms are not improving sooner if worsening   Reviewed expectations re: course of current medical issues. Discussed self-management of symptoms. Outlined signs and symptoms indicating need for more acute intervention. Patient verbalized understanding and all questions were answered. Patient received an After-Visit Summary.    Orders Placed This Encounter  Procedures   Upper Respiratory Culture   POCT rapid strep A   Meds ordered this encounter  Medications   doxycycline (VIBRA-TABS) 100 MG tablet    Sig: Take 1 tablet (100 mg total) by mouth 2 (two) times daily.    Dispense:  20 tablet    Refill:  0   Referral Orders  No referral(s) requested today     Note is dictated utilizing voice recognition software.  Although note has been proof read prior to signing, occasional typographical errors still can be missed. If any questions arise, please do not hesitate to call for verification.   electronically signed by:  Felix Pacini, DO  [facility] [place]

## 2022-06-25 NOTE — Telephone Encounter (Signed)
Pts mom called stating that her son has more white spots and that he went to the emergency room and the fast strp test came back negative, however she is requesting that Dr. Anitra Lauth order the lab or long test. She is also wondering if he should come in for an appt. Please reach out to the patient.

## 2022-06-25 NOTE — Telephone Encounter (Addendum)
Transition Care Management Follow-up Telephone Call Date of discharge and from where: 06/24/22 [facility] How have you been since you were released from the hospital? no Any questions or concerns? Yes  Items Reviewed: Did the pt receive and understand the discharge instructions provided? Yes  Medications obtained and verified? No  Other? No  Any new allergies since your discharge? No  Dietary orders reviewed? Yes Do you have support at home? Yes   Home Care and Equipment/Supplies: Were home health services ordered? not applicable If so, what is the name of the agency? N/a  Has the agency set up a time to come to the patient's home? not applicable Were any new equipment or medical supplies ordered?  No What is the name of the medical supply agency? N/a Were you able to get the supplies/equipment? not applicable Do you have any questions related to the use of the equipment or supplies? No  Functional Questionnaire: (I = Independent and D = Dependent) ADLs: I  Bathing/Dressing- I  Meal Prep- I  Eating- I  Maintaining continence- I  Transferring/Ambulation- I  Managing Meds- I  Follow up appointments reviewed:  PCP Hospital f/u appt confirmed?  Pt seeing other provider in office on 06/25/22   Specialist Hospital f/u appt confirmed? No   Are transportation arrangements needed? No  If their condition worsens, is the pt aware to call PCP or go to the Emergency Dept.? Yes Was the patient provided with contact information for the PCP's office or ED? Yes Was to pt encouraged to call back with questions or concerns? Yes

## 2022-06-25 NOTE — Telephone Encounter (Signed)
Please encourage mom to schedule appt to further discuss and have re-evaluation.

## 2022-06-25 NOTE — Patient Instructions (Signed)

## 2022-06-27 ENCOUNTER — Telehealth: Payer: Self-pay | Admitting: Family Medicine

## 2022-06-27 LAB — CULTURE, UPPER RESPIRATORY
MICRO NUMBER:: 14101030
SPECIMEN QUALITY:: ADEQUATE

## 2022-06-27 NOTE — Telephone Encounter (Signed)
These inform patient: He has strep throat.  The throat culture isolated 2 different strains of strep. The medication prescribed will treat appropriately.   Make sure to take the full course as prescribed.

## 2022-06-27 NOTE — Telephone Encounter (Signed)
Spoke with patient regarding results/recommendations.  

## 2022-07-01 ENCOUNTER — Telehealth: Payer: Self-pay

## 2022-07-01 NOTE — Telephone Encounter (Signed)
Mom Celeste calling in regards to son (DPR).  She is wondering if Dr. Anitra Lauth has tested his testosterone level.  I told her that I could not see in all labs that this was tested. Patient has appt to see Rheumatologist on Thursday, 11/2.  Patient is complaining of his jaw hurting and thinks he has had some muscle loss.  Please call Mom at [phone]

## 2022-07-02 NOTE — Telephone Encounter (Signed)
Called and talked to William Paul, pt's mom. He did take the steroids I rx'd about a month ago and felt better but all sx's returned when he finished them. His throat is better since getting on abx from Dr. Raoul Pitch last week. I'll check CPK and testosterone in future if rheum does not. Initial consult with rheum is tomorrow.  Signed:  Crissie Sickles, MD           07/02/2022

## 2022-07-07 ENCOUNTER — Encounter: Payer: Self-pay | Admitting: Family Medicine

## 2022-07-08 ENCOUNTER — Telehealth: Payer: Self-pay | Admitting: Family Medicine

## 2022-07-08 NOTE — Telephone Encounter (Signed)
Pts mother called and had some inquires about some things regarding strep throat. 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 However she is wondering if her family may be a potential carrier for Strep and if they should be tested or take antibiotics? I encouraged the patient to schedule an appointment. Please give the patients mom a call back to discuss at [phone]

## 2022-07-08 NOTE — Telephone Encounter (Signed)
Please review and advise appropriate next steps.

## 2022-07-09 NOTE — Telephone Encounter (Signed)
Mom following up from call yesterday.  She is requesting that Duwayne Heck and herself be tested to check if they are strep carriers because of Marcello Moores recent diagnosis.  Please call [facility] [phone]

## 2022-07-09 NOTE — Telephone Encounter (Signed)
Okay for patient's mom and brother Duwayne Heck to come in for throat swab to send for culture.  I will enter orders.

## 2022-07-09 NOTE — Telephone Encounter (Signed)
LM for pt's mother to return call. Pls see message below

## 2022-07-09 NOTE — Telephone Encounter (Signed)
William Paul returned call.  Scheduled appt for her and William Paul for nurse visit on Friday 11/10 at 2:45pm.

## 2022-07-09 NOTE — Telephone Encounter (Signed)
Message has already been sent to provider for review. See below

## 2022-07-09 NOTE — Telephone Encounter (Signed)
Noted  

## 2022-12-04 ENCOUNTER — Encounter: Payer: Self-pay | Admitting: Family Medicine

## 2023-01-07 ENCOUNTER — Encounter (HOSPITAL_BASED_OUTPATIENT_CLINIC_OR_DEPARTMENT_OTHER): Payer: Self-pay | Admitting: Emergency Medicine

## 2023-01-07 ENCOUNTER — Emergency Department (HOSPITAL_BASED_OUTPATIENT_CLINIC_OR_DEPARTMENT_OTHER)
Admission: EM | Admit: 2023-01-07 | Discharge: 2023-01-08 | Disposition: A | Discharge status: Home / Self Care | Payer: Commercial Managed Care - PPO | Attending: Emergency Medicine | Admitting: Emergency Medicine

## 2023-01-07 ENCOUNTER — Other Ambulatory Visit: Payer: Self-pay

## 2023-01-07 DIAGNOSIS — W268XXA Contact with other sharp object(s), not elsewhere classified, initial encounter: Secondary | ICD-10-CM | POA: Insufficient documentation

## 2023-01-07 DIAGNOSIS — S61412A Laceration without foreign body of left hand, initial encounter: Secondary | ICD-10-CM | POA: Diagnosis not present

## 2023-01-07 DIAGNOSIS — Y92810 Car as the place of occurrence of the external cause: Secondary | ICD-10-CM | POA: Diagnosis not present

## 2023-01-07 DIAGNOSIS — Y9389 Activity, other specified: Secondary | ICD-10-CM | POA: Diagnosis not present

## 2023-01-07 DIAGNOSIS — S6992XA Unspecified injury of left wrist, hand and finger(s), initial encounter: Secondary | ICD-10-CM | POA: Diagnosis present

## 2023-01-07 NOTE — ED Triage Notes (Signed)
Pt with laceration to LT middle knuckle while working on a car today

## 2023-01-08 ENCOUNTER — Telehealth: Payer: Self-pay

## 2023-01-08 MED ORDER — LIDOCAINE-EPINEPHRINE (PF) 2 %-1:200000 IJ SOLN
20.0000 mL | Freq: Once | INTRAMUSCULAR | Status: AC
  Administered 2023-01-08: 20 mL
  Filled 2023-01-08: qty 20

## 2023-01-08 NOTE — Telephone Encounter (Signed)
TOC completed 

## 2023-01-08 NOTE — ED Provider Notes (Signed)
[place] [facility] AT [facility] Provider Note   CSN: [id number] Arrival date & time: 01/07/23  2052     History  Chief Complaint  Patient presents with   Laceration    William Paul is a 22 y.o. male.  The history is provided by the patient.  Laceration Location:  Hand Hand laceration location:  Dorsum of L hand Length:  1 Depth:  Through dermis Quality: straight   Bleeding: controlled   Time since incident:  7 hours Laceration mechanism:  Metal edge Pain details:    Quality:  Aching   Timing:  Constant   Progression:  Unchanged Foreign body present:  No foreign bodies Relieved by:  Nothing Worsened by:  Nothing Ineffective treatments:  None tried Tetanus status:  Up to date Associated symptoms: no fever        Home Medications Prior to Admission medications   Medication Sig Start Date End Date Taking? Authorizing Provider  acetaminophen (TYLENOL) 325 MG tablet Take 2 tablets (650 mg total) by mouth every 6 (six) hours as needed. 06/24/22   Sloan Leiter, DO  doxycycline (VIBRA-TABS) 100 MG tablet Take 1 tablet (100 mg total) by mouth 2 (two) times daily. 06/25/22   Kuneff, Renee A, DO  ibuprofen (ADVIL) 600 MG tablet Take 1 tablet (600 mg total) by mouth every 6 (six) hours as needed. 06/24/22   Sloan Leiter, DO  meloxicam (MOBIC) 15 MG tablet Take 15 mg by mouth daily. 04/29/22   [provider]  menthol-cetylpyridinium (CEPACOL) 3 MG lozenge Take 1 lozenge (3 mg total) by mouth as needed for sore throat. 06/24/22   Sloan Leiter, DO  metoCLOPramide (REGLAN) 5 MG tablet Take 1 tablet (5 mg total) by mouth every 6 (six) hours as needed (headache). 06/24/22   Sloan Leiter, DO  predniSONE (DELTASONE) 10 MG tablet 3 tabs po qd x 3d, then 2 tabs po qd x 3d, then 1 tab po qd x 3d 06/05/22   McGowen, Maryjean Morn, MD      Allergies    Amoxicillin    Review of Systems   Review of Systems  Constitutional:  Negative for fever.   HENT:  Negative for ear pain.   Eyes:  Negative for redness.  Respiratory:  Negative for wheezing and stridor.   Cardiovascular:  Negative for chest pain.  Skin:  Positive for wound.  All other systems reviewed and are negative.   Physical Exam Updated Vital Signs BP 121/74 (BP Location: Right Arm)   Pulse 76   Temp 98 F (36.7 C)   Resp 20   Ht 5\' 11"  (1.803 m)   Wt 70.3 kg   SpO2 99%   BMI 21.62 kg/m  Physical Exam Vitals and nursing note reviewed.  Constitutional:      General: He is not in acute distress.    Appearance: He is well-developed. He is not diaphoretic.  HENT:     Head: Normocephalic and atraumatic.  Eyes:     Conjunctiva/sclera: Conjunctivae normal.     Pupils: Pupils are equal, round, and reactive to light.  Cardiovascular:     Rate and Rhythm: Normal rate and regular rhythm.  Pulmonary:     Effort: Pulmonary effort is normal.     Breath sounds: Normal breath sounds. No wheezing or rales.  Abdominal:     General: Bowel sounds are normal.     Palpations: Abdomen is soft.     Tenderness: There is no  abdominal tenderness. There is no guarding or rebound.  Musculoskeletal:        General: Normal range of motion.     Left hand: Laceration present.       Arms:     Cervical back: Normal range of motion and neck supple.  Skin:    General: Skin is warm and dry.  Neurological:     Mental Status: He is alert and oriented to person, place, and time.     ED Results / Procedures / Treatments   Labs (all labs ordered are listed, but only abnormal results are displayed) Labs Reviewed - No data to display  EKG None  Radiology No results found.  Procedures .Marland KitchenLaceration Repair  Date/Time: 01/08/2023 1:08 AM  Performed by: Cy Blamer, MD Authorized by: Cy Blamer, MD   Consent:    Consent obtained:  Verbal   Consent given by:  Patient   Risks discussed:  Infection, need for additional repair, nerve damage, pain, poor cosmetic result, poor  wound healing and tendon damage   Alternatives discussed:  No treatment Universal protocol:    Patient identity confirmed:  Arm band Anesthesia:    Anesthesia method:  Local infiltration   Local anesthetic:  Lidocaine 1% WITH epi Laceration details:    Location:  Hand   Hand location:  L hand, dorsum   Length (cm):  1   Depth (mm):  1 Pre-procedure details:    Preparation:  Patient was prepped and draped in usual sterile fashion Exploration:    Hemostasis achieved with:  Cautery   Imaging outcome: foreign body not noted     Wound exploration: wound explored through full range of motion     Wound extent: fascia not violated   Treatment:    Area cleansed with:  Povidone-iodine, chlorhexidine, saline and Shur-Clens   Amount of cleaning:  Extensive   Irrigation solution:  Sterile saline   Irrigation method:  Syringe   Debridement:  Extensive Skin repair:    Repair method:  Sutures   Suture size:  4-0   Suture material:  Nylon   Suture technique:  Simple interrupted   Number of sutures:  3 Repair type:    Repair type:  Simple Post-procedure details:    Dressing:  Sterile dressing   Procedure completion:  Tolerated well, no immediate complications Comments:     Well appearing.  Cleansed extensively with surgical sponge.  Then chlorhexidine.  CLosed wound.  Suture removal in 12 days at urgent care.  No submersion of wound in any body of water for 2 weeks.  Stable for discharge.  Strict return     Medications Ordered in ED Medications  lidocaine-EPINEPHrine (XYLOCAINE W/EPI) 2 %-1:200000 (PF) injection 20 mL (has no administration in time range)    ED Course/ Medical Decision Making/ A&P                             Medical Decision Making Laceration to the knuckle left hand doing work on care   Amount and/or Complexity of Data Reviewed Independent Historian: parent    Details: See above  External Data Reviewed: notes.    Details: Previous notes reviewed    Risk Prescription drug management.    Final Clinical Impression(s) / ED Diagnoses Final diagnoses:  Laceration of left hand without foreign body, initial encounter   Return for intractable cough, coughing up blood, fevers > 100.4 unrelieved by medication, shortness of breath, intractable vomiting,  chest pain, shortness of breath, weakness, numbness, changes in speech, facial asymmetry, abdominal pain, passing out, Inability to tolerate liquids or food, cough, altered mental status or any concerns. No signs of systemic illness or infection. The patient is nontoxic-appearing on exam and vital signs are within normal limits.  I have reviewed the triage vital signs and the nursing notes. Pertinent labs & imaging results that were available during my care of the patient were reviewed by me and considered in my medical decision making (see chart for details). After history, exam, and medical workup I feel the patient has been appropriately medically screened and is safe for discharge home. Pertinent diagnoses were discussed with the patient. Patient was given return precautions.  Rx / DC Orders ED Discharge Orders     None         Consuelo Suthers, MD 01/08/23 [id number]

## 2023-01-20 ENCOUNTER — Ambulatory Visit: Payer: Commercial Managed Care - PPO | Admitting: Family Medicine

## 2023-01-29 ENCOUNTER — Ambulatory Visit: Payer: Commercial Managed Care - PPO | Admitting: Family Medicine

## 2023-03-25 ENCOUNTER — Encounter: Payer: Self-pay | Admitting: Family Medicine

## 2023-03-25 ENCOUNTER — Ambulatory Visit (INDEPENDENT_AMBULATORY_CARE_PROVIDER_SITE_OTHER): Payer: Commercial Managed Care - PPO | Admitting: Family Medicine

## 2023-03-25 VITALS — BP 121/76 | HR 52 | Wt 154.6 lb

## 2023-03-25 DIAGNOSIS — Z0000 Encounter for general adult medical examination without abnormal findings: Secondary | ICD-10-CM

## 2023-03-25 DIAGNOSIS — Z23 Encounter for immunization: Secondary | ICD-10-CM

## 2023-03-25 NOTE — Progress Notes (Signed)
Office Note 03/25/2023  CC:  Chief Complaint  Patient presents with   Annual Exam   HPI:  Patient is a 22 y.o. male who is here for annual health maintenance exam. He is followed by Dr. Drenda Freeze with [facility] rheumatology for ankylosing spondylitis. He is on Enbrel and meloxicam.  He feels like these are helping and keeping him very functional.  He will be starting college at [employer] this fall.   Past Medical History:  Diagnosis Date   Acromioclavicular separation, type 1 01/2021   Ankylosing spondylitis (HCC) 2023   Dr. Drenda Freeze, rheum 07/2022 (knees and ankles synovitis, achilles tendonitis).  Enbrel as of 2024   Concussion 06/2018 and 09/2018   Concussion clinic (Dr. Katrinka Blazing)   Grade 3 ankle sprain 04/2018   [facility]--Dr. Cleophas Dunker.  +medial malleolus avulsion fracture with the sprain-->walking boot.   History of fracture of phalanx of finger    R   Left knee injury    effusion and popliteal cyst on MRI (emergeortho)->aspirated and injected 05/15/22   Nummular eczema 07/2018   Osgood-Schlatter's disease of right knee    Patellar subluxation    R   Tear of ulnar collateral ligament of right elbow 10/11/2020   (wrestling)->surgery (Dr. Melvyn Novas)    Past Surgical History:  Procedure Laterality Date   chest xray  05/29/2019   fl esophagram  05/29/2019    Family History  Problem Relation Age of Onset   Thyroid cancer Father 46   Brain cancer Sister    Mental illness Paternal Uncle    Mental illness Paternal Grandmother     Social History   Socioeconomic History   Marital status: Single    Spouse name: Not on file   Number of children: Not on file   Years of education: Not on file   Highest education level: Not on file  Occupational History   Not on file  Tobacco Use   Smoking status: Never   Smokeless tobacco: Never  Vaping Use   Vaping status: Never Used  Substance and Sexual Activity   Alcohol use: No   Drug use: No   Sexual activity:  Not on file  Other Topics Concern   Not on file  Social History Narrative   Parents divorced, lives part time with each.   Has 3 brothers.   Home-schooled.   No tobacco exposure.   Social Determinants of Health   Financial Resource Strain: Not on file  Food Insecurity: Not on file  Transportation Needs: Not on file  Physical Activity: Not on file  Stress: Not on file  Social Connections: Not on file  Intimate Partner Violence: Not on file    Outpatient Medications Prior to Visit  Medication Sig Dispense Refill   meloxicam (MOBIC) 15 MG tablet Take 15 mg by mouth daily.     acetaminophen (TYLENOL) 325 MG tablet Take 2 tablets (650 mg total) by mouth every 6 (six) hours as needed. (Patient not taking: Reported on 03/25/2023) 36 tablet 0   doxycycline (VIBRA-TABS) 100 MG tablet Take 1 tablet (100 mg total) by mouth 2 (two) times daily. (Patient not taking: Reported on 03/25/2023) 20 tablet 0   ibuprofen (ADVIL) 600 MG tablet Take 1 tablet (600 mg total) by mouth every 6 (six) hours as needed. (Patient not taking: Reported on 03/25/2023) 30 tablet 0   menthol-cetylpyridinium (CEPACOL) 3 MG lozenge Take 1 lozenge (3 mg total) by mouth as needed for sore throat. (Patient not taking: Reported on 03/25/2023) 45 tablet  0   metoCLOPramide (REGLAN) 5 MG tablet Take 1 tablet (5 mg total) by mouth every 6 (six) hours as needed (headache). (Patient not taking: Reported on 03/25/2023) 6 tablet 0   predniSONE (DELTASONE) 10 MG tablet 3 tabs po qd x 3d, then 2 tabs po qd x 3d, then 1 tab po qd x 3d (Patient not taking: Reported on 03/25/2023) 18 tablet 0   No facility-administered medications prior to visit.    Allergies  Allergen Reactions   Amoxicillin Rash    Review of Systems  Constitutional:  Negative for appetite change, chills, fatigue and fever.  HENT:  Negative for congestion, dental problem, ear pain and sore throat.   Eyes:  Negative for discharge, redness and visual disturbance.   Respiratory:  Negative for cough, chest tightness, shortness of breath and wheezing.   Cardiovascular:  Negative for chest pain, palpitations and leg swelling.  Gastrointestinal:  Negative for abdominal pain, blood in stool, diarrhea, nausea and vomiting.  Genitourinary:  Negative for difficulty urinating, dysuria, flank pain, frequency, hematuria and urgency.  Musculoskeletal:  Negative for arthralgias, back pain, joint swelling, myalgias and neck stiffness.  Skin:  Negative for pallor and rash.  Neurological:  Negative for dizziness, speech difficulty, weakness and headaches.  Hematological:  Negative for adenopathy. Does not bruise/bleed easily.  Psychiatric/Behavioral:  Negative for confusion and sleep disturbance. The patient is not nervous/anxious.     PE;    03/25/2023   10:22 AM 01/08/2023    1:26 AM 01/07/2023    9:10 PM  Vitals with BMI  Weight 154 lbs 10 oz    Systolic 121 122 409  Diastolic 76 76 74  Pulse 52 74 76    Gen: Alert, well appearing.  Patient is oriented to person, place, time, and situation. AFFECT: pleasant, lucid thought and speech. ENT: Ears: EACs clear, normal epithelium.  TMs with good light reflex and landmarks bilaterally.  Eyes: no injection, icteris, swelling, or exudate.  EOMI, PERRLA. Nose: no drainage or turbinate edema/swelling.  No injection or focal lesion.  Mouth: lips without lesion/swelling.  Oral mucosa pink and moist.  Dentition intact and without obvious caries or gingival swelling.  Oropharynx without erythema, exudate, or swelling.  Neck: supple/nontender.  No LAD, mass, or TM.  Carotid pulses 2+ bilaterally, without bruits. CV: RRR, no m/r/g.   LUNGS: CTA bilat, nonlabored resps, good aeration in all lung fields. ABD: soft, NT, ND, BS normal.  No hepatospenomegaly or mass.  No bruits. EXT: no clubbing, cyanosis, or edema.  Musculoskeletal: no joint swelling, erythema, warmth, or tenderness.  ROM of all joints intact. Skin - no sores or  suspicious lesions or rashes or color changes  Pertinent labs:  Lab Results  Component Value Date   TSH 1.88 05/21/2022   Lab Results  Component Value Date   WBC 17.1 (H) 06/24/2022   HGB 14.2 06/24/2022   HCT 41.5 06/24/2022   MCV 88.5 06/24/2022   PLT 274 06/24/2022   Lab Results  Component Value Date   CREATININE 1.07 06/24/2022   BUN 11 06/24/2022   NA 133 (L) 06/24/2022   K 3.4 (L) 06/24/2022   CL 102 06/24/2022   CO2 23 06/24/2022   Lab Results  Component Value Date   ALT 12 06/24/2022   AST 18 06/24/2022   ALKPHOS 66 06/24/2022   BILITOT 1.1 06/24/2022   Lab Results  Component Value Date   ESRSEDRATE 50 (H) 06/05/2022   ASSESSMENT AND PLAN:   Health  maintenance exam: Reviewed age and gender appropriate health maintenance issues (prudent diet, regular exercise, health risks of tobacco and excessive alcohol, use of seatbelts, fire alarms in home, use of sunscreen).  Also reviewed age and gender appropriate health screening as well as vaccine recommendations. Vaccines: Tdap--> given today.  He is not sure the exact vaccine requirements for [facility].  He will check on this.  Specifically, he will see if varicella vaccine or proof of varicella immunity is required.  Additionally he will check to see if he has to have a third hep B and a second meningitis vaccine.  An After Visit Summary was printed and given to the patient.  FOLLOW UP: 1 year CPE  Signed:  Santiago Bumpers, MD           03/25/2023

## 2023-12-18 ENCOUNTER — Other Ambulatory Visit: Payer: Self-pay | Admitting: Family Medicine

## 2023-12-18 DIAGNOSIS — S42001D Fracture of unspecified part of right clavicle, subsequent encounter for fracture with routine healing: Secondary | ICD-10-CM

## 2023-12-18 DIAGNOSIS — S72401D Unspecified fracture of lower end of right femur, subsequent encounter for closed fracture with routine healing: Secondary | ICD-10-CM

## 2023-12-28 HISTORY — PX: CLAVICLE SURGERY: SHX598

## 2024-02-03 ENCOUNTER — Ambulatory Visit: Admitting: Family Medicine

## 2024-02-03 ENCOUNTER — Encounter: Payer: Self-pay | Admitting: Family Medicine

## 2024-02-03 VITALS — BP 147/86 | HR 79 | Temp 98.9°F | Ht 71.0 in | Wt 155.0 lb

## 2024-02-03 DIAGNOSIS — S20461A Insect bite (nonvenomous) of right back wall of thorax, initial encounter: Secondary | ICD-10-CM

## 2024-02-03 DIAGNOSIS — B882 Other arthropod infestations: Secondary | ICD-10-CM | POA: Diagnosis not present

## 2024-02-03 DIAGNOSIS — R509 Fever, unspecified: Secondary | ICD-10-CM | POA: Diagnosis not present

## 2024-02-03 DIAGNOSIS — W57XXXA Bitten or stung by nonvenomous insect and other nonvenomous arthropods, initial encounter: Secondary | ICD-10-CM | POA: Diagnosis not present

## 2024-02-03 MED ORDER — SULFAMETHOXAZOLE-TRIMETHOPRIM 800-160 MG PO TABS: 1.0000 | ORAL_TABLET | Freq: Two times a day (BID) | ORAL | 0 refills | Status: AC

## 2024-02-03 MED ORDER — DOXYCYCLINE HYCLATE 100 MG PO CAPS: 100.0000 mg | ORAL_CAPSULE | Freq: Two times a day (BID) | ORAL | 0 refills | Status: AC
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
## 2024-02-11 ENCOUNTER — Encounter: Payer: Self-pay | Admitting: Family Medicine

## 2024-02-11 ENCOUNTER — Ambulatory Visit (INDEPENDENT_AMBULATORY_CARE_PROVIDER_SITE_OTHER): Admitting: Family Medicine

## 2024-02-11 VITALS — BP 129/87 | HR 77 | Temp 99.2°F | Ht 71.0 in | Wt 151.4 lb

## 2024-02-11 DIAGNOSIS — R509 Fever, unspecified: Secondary | ICD-10-CM | POA: Diagnosis not present

## 2024-02-11 MED ORDER — DOXYCYCLINE HYCLATE 100 MG PO CAPS: 100.0000 mg | ORAL_CAPSULE | Freq: Two times a day (BID) | ORAL | 0 refills | Status: AC
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
## 2024-02-22 ENCOUNTER — Other Ambulatory Visit: Payer: Self-pay

## 2024-02-22 ENCOUNTER — Encounter (HOSPITAL_BASED_OUTPATIENT_CLINIC_OR_DEPARTMENT_OTHER): Payer: Self-pay | Admitting: Orthopaedic Surgery
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
## 2024-02-23 NOTE — Discharge Instructions (Signed)
 No tylenol  until 3:15 p.m. No ibuprofen  until 7:00 p.m.  Bonner Hair MD, MPH Aleck Stalling, PA-C [facility] [address] [phone] (tel)   [phone] (fax)   POST-OPERATIVE INSTRUCTIONS - ACL RECONSTRUCTION  WOUND CARE You may remove the Operative Dressing on Post-Op Day #3 (72hrs after surgery).   Leave steri strips in place.   If you feel more comfortable with it you can leave all dressings in place till your 1 week follow-up appointment.   KEEP THE INCISIONS CLEAN AND DRY. An ACE wrap may be used to control swelling, do not wrap this too tight.  If the initial ACE wrap feels too tight or constricting you may loosen it. There may be a small amount of fluid/bleeding leaking at the surgical site.  This is normal; the knee is filled with fluid during the procedure and can leak for 24-48hrs after surgery.  You may change/reinforce the bandage as needed.  Use the Cryocuff, GameReady or Ice as often as possible for the first 3-4 days, then as needed for pain relief. Always keep a towel, ACE wrap or other barrier between the cooling unit and your skin.  You may shower on Post-Op Day #3. Gently pat the area dry.  Do not soak the knee in water.  Do not go swimming in the pool or ocean until 4 weeks after surgery or when otherwise instructed.  BRACE/AMBULATION Your leg will be placed in a brace post-operatively.  You may remove for hygiene only! You will need to wear your brace at all times until we discuss it further.  It should be locked in full extension (0 degrees) if adjustable.   You will be instructed on further bracing after your first visit. Use crutches for comfort but you can put your full weight on the leg as tolerated.  PHYSICAL THERAPY - You will begin physical therapy soon after surgery (unless otherwise specified) - Please call to set up an appointment, if you do not already have one   - A PT referral was sent to [facility]  PT  REGIONAL ANESTHESIA (NERVE BLOCKS) The anesthesia team may have performed a nerve block for you this is a great tool used to minimize pain.   The block may start wearing off overnight (between 8-24 hours postop) When the block wears off, your pain may go from nearly zero to the pain you would have had postop without the block. This is an abrupt transition but nothing dangerous is happening.   This can be a challenging period but utilize your as needed pain medications to try and manage this period. We suggest you use the pain medication the first night prior to going to bed, to ease this transition.  You may take an extra dose of narcotic when this happens if needed  POST-OP MEDICATIONS- Multimodal approach to pain control In general your pain will be controlled with a combination of substances.  Prescriptions unless otherwise discussed are electronically sent to your pharmacy.  This is a carefully made plan we use to minimize narcotic use.     Meloxicam - Anti-inflammatory medication taken on a scheduled basis Acetaminophen  - Non-narcotic pain medicine taken on a scheduled basis  Oxycodone - This is a strong narcotic, to be used only on an "as needed" basis for SEVERE pain. Aspirin 81mg  - This medicine is used to minimize the risk of blood clots after surgery. Zofran  - take as needed for nausea   FOLLOW-UP Please call the  office to schedule a follow-up appointment for your incision check if you do not already have one, 7-10 days post-operatively. IF YOU HAVE ANY QUESTIONS, PLEASE FEEL FREE TO CALL OUR OFFICE.  HELPFUL INFORMATION   Keep your leg elevated to decrease swelling, which will then in turn decrease your pain. I would elevate the foot of your bed by putting a couple of couch pillows between your mattress and box spring. I would not keep pillow directly under your ankle.  You must wear the brace locked while sleeping and ambulating until follow-up.   There will be MORE  swelling on days 1-3 than there is on the day of surgery.  This also is normal. The swelling will decrease with the anti-inflammatory medication, ice and keeping it elevated. The swelling will make it more difficult to bend your knee. As the swelling goes down your motion will become easier  You may develop swelling and bruising that extends from your knee down to your calf and perhaps even to your foot over the next week. Do not be alarmed. This too is normal, and it is due to gravity  There may be some numbness adjacent to the incision site. This may last for 6-12 months or longer in some patients and is expected.  You may return to sedentary work/school in the next couple of days when you feel up to it. You will need to keep your leg elevated as much as possible   You should wean off your narcotic medicines as soon as you are able.  Most patients will be off narcotics before their first postop appointment.   We suggest you use the pain medication the first night prior to going to bed, in order to ease any pain when the anesthesia wears off. You should avoid taking pain medications on an empty stomach as it will make you nauseous.  Do not drink alcoholic beverages or take illicit drugs when taking pain medications.  It is against the law to drive while taking narcotics. You cannot drive if your Right leg is in brace locked in extension.  Pain medication may make you constipated.  Below are a few solutions to try in this order: Decrease the amount of pain medication if you aren't having pain. Drink lots of decaffeinated fluids. Drink prune juice and/or each dried prunes  If the first 3 don't work start with additional solutions Take Colace - an over-the-counter stool softener Take Senokot - an over-the-counter laxative Take Miralax - a stronger over-the-counter laxative  For more information including helpful videos and documents visit our website:    https://[email or web address].[email or web address]-information.[email or web address]  Regional Anesthesia Blocks  1. You may not be able to move or feel the blocked extremity after a regional anesthetic block. This may last may last from 3-48 hours after placement, but it will go away. The length of time depends on the medication injected and your individual response to the medication. As the nerves start to wake up, you may experience tingling as the movement and feeling returns to your extremity. If the numbness and inability to move your extremity has not gone away after 48 hours, please call your surgeon.   2. The extremity that is blocked will need to be protected until the numbness is gone and the strength has returned. Because you cannot feel it, you will need to take extra care to avoid injury. Because it may be weak, you may have difficulty moving it or using it. You may not know what position it is  in without looking at it while the block is in effect.  3. For blocks in the legs and feet, returning to weight bearing and walking needs to be done carefully. You will need to wait until the numbness is entirely gone and the strength has returned. You should be able to move your leg and foot normally before you try and bear weight or walk. You will need someone to be with you when you first try to ensure you do not fall and possibly risk injury.  4. Bruising and tenderness at the needle site are common side effects and will resolve in a few days.  5. Persistent numbness or new problems with movement should be communicated to the surgeon or the [facility] [phone] [facility] [phone]). Post Anesthesia Home Care Instructions  Activity: Get plenty of rest for the remainder of the day. A responsible individual must stay with you for 24 hours following the procedure.  For the next 24 hours, DO NOT: -Drive a car -Advertising copywriter -Drink alcoholic beverages -Take any medication  unless instructed by your physician -Make any legal decisions or sign important papers.  Meals: Start with liquid foods such as gelatin or soup. Progress to regular foods as tolerated. Avoid greasy, spicy, heavy foods. If nausea and/or vomiting occur, drink only clear liquids until the nausea and/or vomiting subsides. Call your physician if vomiting continues.  Special Instructions/Symptoms: Your throat may feel dry or sore from the anesthesia or the breathing tube placed in your throat during surgery. If this causes discomfort, gargle with warm salt water. The discomfort should disappear within 24 hours.  If you had a scopolamine patch placed behind your ear for the management of post- operative nausea and/or vomiting:  1. The medication in the patch is effective for 72 hours, after which it should be removed.  Wrap patch in a tissue and discard in the trash. Wash hands thoroughly with soap and water. 2. You may remove the patch earlier than 72 hours if you experience unpleasant side effects which may include dry mouth, dizziness or visual disturbances. 3. Avoid touching the patch. Wash your hands with soap and water after contact with the patch.

## 2024-02-25 ENCOUNTER — Other Ambulatory Visit: Payer: Self-pay

## 2024-02-25 ENCOUNTER — Ambulatory Visit (HOSPITAL_BASED_OUTPATIENT_CLINIC_OR_DEPARTMENT_OTHER)
Admission: RE | Admit: 2024-02-25 | Discharge: 2024-02-25 | Disposition: A | Discharge status: Home / Self Care | Attending: Orthopaedic Surgery | Admitting: Orthopaedic Surgery

## 2024-02-25 ENCOUNTER — Encounter (HOSPITAL_BASED_OUTPATIENT_CLINIC_OR_DEPARTMENT_OTHER)
Admission: RE | Disposition: A | Discharge status: Home / Self Care | Payer: Self-pay | Source: Home / Self Care | Attending: Orthopaedic Surgery

## 2024-02-25 ENCOUNTER — Encounter (HOSPITAL_BASED_OUTPATIENT_CLINIC_OR_DEPARTMENT_OTHER): Payer: Self-pay | Admitting: Orthopaedic Surgery

## 2024-02-25 ENCOUNTER — Ambulatory Visit (HOSPITAL_BASED_OUTPATIENT_CLINIC_OR_DEPARTMENT_OTHER): Admitting: Anesthesiology

## 2024-02-25 DIAGNOSIS — S83501A Sprain of unspecified cruciate ligament of right knee, initial encounter: Secondary | ICD-10-CM

## 2024-02-25 DIAGNOSIS — S83511A Sprain of anterior cruciate ligament of right knee, initial encounter: Secondary | ICD-10-CM | POA: Insufficient documentation

## 2024-02-25 DIAGNOSIS — S83241A Other tear of medial meniscus, current injury, right knee, initial encounter: Secondary | ICD-10-CM | POA: Insufficient documentation

## 2024-02-25 DIAGNOSIS — M92521 Juvenile osteochondrosis of tibia tubercle, right leg: Secondary | ICD-10-CM | POA: Insufficient documentation

## 2024-02-25 HISTORY — PX: ANTERIOR CRUCIATE LIGAMENT REPAIR: SHX115

## 2024-02-25 SURGERY — RECONSTRUCTION, KNEE, ACL
Anesthesia: Regional | Site: Knee | Laterality: Right

## 2024-02-25 MED ORDER — PROPOFOL 10 MG/ML IV BOLUS
INTRAVENOUS | Status: DC | PRN
  Administered 2024-02-25: 200 mg via INTRAVENOUS
  Administered 2024-02-25: 50 mg via INTRAVENOUS

## 2024-02-25 MED ORDER — KETOROLAC TROMETHAMINE 30 MG/ML IJ SOLN
30.0000 mg | Freq: Once | INTRAMUSCULAR | Status: AC | PRN
  Administered 2024-02-25: 30 mg via INTRAVENOUS

## 2024-02-25 MED ORDER — BUPIVACAINE-EPINEPHRINE (PF) 0.5% -1:200000 IJ SOLN
INTRAMUSCULAR | Status: DC | PRN
  Administered 2024-02-25: 30 mL via PERINEURAL

## 2024-02-25 MED ORDER — ONDANSETRON HCL 4 MG/2ML IJ SOLN
INTRAMUSCULAR | Status: DC | PRN
  Administered 2024-02-25: 4 mg via INTRAVENOUS

## 2024-02-25 MED ORDER — ASPIRIN 81 MG PO CHEW
81.0000 mg | CHEWABLE_TABLET | Freq: Two times a day (BID) | ORAL | 0 refills | Status: DC
Start: 2024-02-25 — End: 2024-03-24

## 2024-02-25 MED ORDER — OXYCODONE HCL 5 MG/5ML PO SOLN: 5.0000 mg | Freq: Once | ORAL | Status: AC | PRN

## 2024-02-25 MED ORDER — SCOPOLAMINE 1 MG/3DAYS TD PT72
MEDICATED_PATCH | TRANSDERMAL | Status: AC
  Filled 2024-02-25: qty 1

## 2024-02-25 MED ORDER — FENTANYL CITRATE (PF) 100 MCG/2ML IJ SOLN
INTRAMUSCULAR | Status: AC
  Filled 2024-02-25: qty 2

## 2024-02-25 MED ORDER — CEFAZOLIN SODIUM-DEXTROSE 2-4 GM/100ML-% IV SOLN
2.0000 g | INTRAVENOUS | Status: AC
  Administered 2024-02-25: 2 g via INTRAVENOUS

## 2024-02-25 MED ORDER — DEXAMETHASONE SODIUM PHOSPHATE 10 MG/ML IJ SOLN
INTRAMUSCULAR | Status: DC | PRN
  Administered 2024-02-25: 10 mg via INTRAVENOUS

## 2024-02-25 MED ORDER — FENTANYL CITRATE (PF) 100 MCG/2ML IJ SOLN
INTRAMUSCULAR | Status: DC | PRN
  Administered 2024-02-25: 50 ug via INTRAVENOUS
  Administered 2024-02-25 (×2): 25 ug via INTRAVENOUS

## 2024-02-25 MED ORDER — LACTATED RINGERS IV SOLN: INTRAVENOUS | Status: DC

## 2024-02-25 MED ORDER — LIDOCAINE 2% (20 MG/ML) 5 ML SYRINGE
INTRAMUSCULAR | Status: AC
  Filled 2024-02-25: qty 5

## 2024-02-25 MED ORDER — SCOPOLAMINE 1 MG/3DAYS TD PT72
1.0000 | MEDICATED_PATCH | TRANSDERMAL | Status: DC
  Administered 2024-02-25: 1.5 mg via TRANSDERMAL

## 2024-02-25 MED ORDER — ACETAMINOPHEN 500 MG PO TABS
ORAL_TABLET | ORAL | Status: AC
  Filled 2024-02-25: qty 2

## 2024-02-25 MED ORDER — ACETAMINOPHEN 500 MG PO TABS: 1000.0000 mg | ORAL_TABLET | Freq: Three times a day (TID) | ORAL | 0 refills | Status: AC

## 2024-02-25 MED ORDER — OXYCODONE HCL 5 MG PO TABS: ORAL_TABLET | ORAL | 0 refills | Status: AC

## 2024-02-25 MED ORDER — GABAPENTIN 300 MG PO CAPS
300.0000 mg | ORAL_CAPSULE | Freq: Once | ORAL | Status: AC
  Administered 2024-02-25: 300 mg via ORAL

## 2024-02-25 MED ORDER — MIDAZOLAM HCL 2 MG/2ML IJ SOLN
2.0000 mg | Freq: Once | INTRAMUSCULAR | Status: AC
  Administered 2024-02-25: 2 mg via INTRAVENOUS

## 2024-02-25 MED ORDER — VANCOMYCIN HCL 1000 MG IV SOLR
INTRAVENOUS | Status: DC | PRN
  Administered 2024-02-25: 1000 mg via TOPICAL

## 2024-02-25 MED ORDER — AMISULPRIDE (ANTIEMETIC) 5 MG/2ML IV SOLN: 10.0000 mg | Freq: Once | INTRAVENOUS | Status: DC | PRN

## 2024-02-25 MED ORDER — LIDOCAINE HCL (CARDIAC) PF 100 MG/5ML IV SOSY
PREFILLED_SYRINGE | INTRAVENOUS | Status: DC | PRN
  Administered 2024-02-25: 60 mg via INTRAVENOUS

## 2024-02-25 MED ORDER — DEXMEDETOMIDINE HCL IN NACL 80 MCG/20ML IV SOLN
INTRAVENOUS | Status: DC | PRN
  Administered 2024-02-25: 8 ug via INTRAVENOUS

## 2024-02-25 MED ORDER — ONDANSETRON HCL 4 MG/2ML IJ SOLN
INTRAMUSCULAR | Status: AC
  Filled 2024-02-25: qty 2

## 2024-02-25 MED ORDER — OXYCODONE HCL 5 MG PO TABS
5.0000 mg | ORAL_TABLET | Freq: Once | ORAL | Status: AC | PRN
  Administered 2024-02-25: 5 mg via ORAL

## 2024-02-25 MED ORDER — MELOXICAM 15 MG PO TABS: 15.0000 mg | ORAL_TABLET | Freq: Every day | ORAL | 0 refills | Status: DC

## 2024-02-25 MED ORDER — SODIUM CHLORIDE 0.9 % IR SOLN
Status: DC | PRN
  Administered 2024-02-25 (×2): 3000 mL

## 2024-02-25 MED ORDER — ACETAMINOPHEN 500 MG PO TABS
1000.0000 mg | ORAL_TABLET | Freq: Once | ORAL | Status: AC
Start: 2024-02-25 — End: 2024-02-25
  Administered 2024-02-25: 1000 mg via ORAL

## 2024-02-25 MED ORDER — PROPOFOL 10 MG/ML IV BOLUS
INTRAVENOUS | Status: AC
  Filled 2024-02-25: qty 20

## 2024-02-25 MED ORDER — MIDAZOLAM HCL 2 MG/2ML IJ SOLN
INTRAMUSCULAR | Status: AC
Start: 2024-02-25 — End: 2024-02-25
  Filled 2024-02-25: qty 2

## 2024-02-25 MED ORDER — KETOROLAC TROMETHAMINE 30 MG/ML IJ SOLN
INTRAMUSCULAR | Status: AC
  Filled 2024-02-25: qty 1

## 2024-02-25 MED ORDER — OXYCODONE HCL 5 MG PO TABS
ORAL_TABLET | ORAL | Status: AC
Start: 2024-02-25 — End: 2024-02-25
  Filled 2024-02-25: qty 1

## 2024-02-25 MED ORDER — GABAPENTIN 300 MG PO CAPS
ORAL_CAPSULE | ORAL | Status: AC
Start: 2024-02-25 — End: 2024-02-25
  Filled 2024-02-25: qty 1

## 2024-02-25 MED ORDER — ONDANSETRON HCL 4 MG PO TABS: 4.0000 mg | ORAL_TABLET | Freq: Three times a day (TID) | ORAL | 0 refills | Status: AC | PRN

## 2024-02-25 MED ORDER — DEXAMETHASONE SODIUM PHOSPHATE 10 MG/ML IJ SOLN
INTRAMUSCULAR | Status: AC
  Filled 2024-02-25: qty 1

## 2024-02-25 MED ORDER — CEFAZOLIN SODIUM-DEXTROSE 2-4 GM/100ML-% IV SOLN
INTRAVENOUS | Status: AC
  Filled 2024-02-25: qty 100

## 2024-02-25 MED ORDER — FENTANYL CITRATE (PF) 100 MCG/2ML IJ SOLN
25.0000 ug | INTRAMUSCULAR | Status: DC | PRN
  Administered 2024-02-25 (×3): 50 ug via INTRAVENOUS

## 2024-02-25 SURGICAL SUPPLY — 53 items
BLADE AVERAGE 25X9 (BLADE) ×1 IMPLANT
BLADE SHAVER BONE 5.0X13 (MISCELLANEOUS) ×1 IMPLANT
BLADE SURG 10 STRL SS (BLADE) ×1 IMPLANT
BLADE SURG 15 STRL LF DISP TIS (BLADE) ×1 IMPLANT
BNDG ELASTIC 6INX 5YD STR LF (GAUZE/BANDAGES/DRESSINGS) ×1 IMPLANT
CHLORAPREP W/TINT 26 (MISCELLANEOUS) ×1 IMPLANT
CLSR STERI-STRIP ANTIMIC 1/2X4 (GAUZE/BANDAGES/DRESSINGS) ×1 IMPLANT
COOLER ICEMAN CLASSIC (MISCELLANEOUS) ×1 IMPLANT
COVER BACK TABLE 60X90IN (DRAPES) ×1 IMPLANT
CUFF TRNQT CYL 34X4.125X (TOURNIQUET CUFF) ×1 IMPLANT
DISSECTOR 4.0MMX13CM CVD (MISCELLANEOUS) ×1 IMPLANT
DRAPE U-SHAPE 47X51 STRL (DRAPES) ×1 IMPLANT
DRAPE-T ARTHROSCOPY W/POUCH (DRAPES) ×1 IMPLANT
ELECTRODE REM PT RTRN 9FT ADLT (ELECTROSURGICAL) ×1 IMPLANT
GAUZE PAD ABD 8X10 STRL (GAUZE/BANDAGES/DRESSINGS) IMPLANT
GAUZE SPONGE 4X4 12PLY STRL (GAUZE/BANDAGES/DRESSINGS) ×2 IMPLANT
GLOVE BIO SURGEON STRL SZ 6.5 (GLOVE) ×1 IMPLANT
GLOVE BIOGEL PI IND STRL 6.5 (GLOVE) ×1 IMPLANT
GLOVE BIOGEL PI IND STRL 8 (GLOVE) ×1 IMPLANT
GLOVE ECLIPSE 8.0 STRL XLNG CF (GLOVE) ×1 IMPLANT
GOWN STRL REUS W/ TWL LRG LVL3 (GOWN DISPOSABLE) ×2 IMPLANT
GOWN STRL REUS W/TWL XL LVL3 (GOWN DISPOSABLE) ×1 IMPLANT
IMMOBILIZER KNEE 22 UNIV (SOFTGOODS) IMPLANT
IMMOBILIZER KNEE 24 THIGH 36 (SOFTGOODS) IMPLANT
IMPL SYS 2ND FX PEEK 4.75X19.1 (Miscellaneous) IMPLANT
KIT TRANSTIBIAL (DISPOSABLE) ×1 IMPLANT
KNIFE GRAFT ACL 10MM 5952 (MISCELLANEOUS) ×1 IMPLANT
KNIFE GRAFT ACL 9MM (MISCELLANEOUS) IMPLANT
MANIFOLD NEPTUNE II (INSTRUMENTS) ×1 IMPLANT
NS IRRIG 1000ML POUR BTL (IV SOLUTION) ×1 IMPLANT
PACK ARTHROSCOPY DSU (CUSTOM PROCEDURE TRAY) ×1 IMPLANT
PACK BASIN DAY SURGERY FS (CUSTOM PROCEDURE TRAY) ×1 IMPLANT
PAD COLD SHLDR WRAP-ON (PAD) ×1 IMPLANT
PADDING CAST ABS COTTON 4X4 ST (CAST SUPPLIES) IMPLANT
PENCIL SMOKE EVACUATOR (MISCELLANEOUS) ×1 IMPLANT
PORTAL SKID DEVICE (INSTRUMENTS) IMPLANT
SCREW SHEATHED INTERF 7X25 (Screw) IMPLANT
SCREW SHEATHED INTERF 8X20MM (Screw) IMPLANT
SLEEVE SCD COMPRESS KNEE MED (STOCKING) IMPLANT
SOL .9 NS 3000ML IRR UROMATIC (IV SOLUTION) ×4 IMPLANT
SPIKE FLUID TRANSFER (MISCELLANEOUS) IMPLANT
SPONGE T-LAP 4X18 ~~LOC~~+RFID (SPONGE) ×1 IMPLANT
STRIP CLOSURE SKIN 1/2X4 (GAUZE/BANDAGES/DRESSINGS) IMPLANT
SUT MNCRL AB 4-0 PS2 18 (SUTURE) ×1 IMPLANT
SUT VIC AB 0 CT1 27XBRD ANBCTR (SUTURE) ×1 IMPLANT
SUT VIC AB 3-0 SH 27X BRD (SUTURE) ×1 IMPLANT
SUTURE FIBERWR #2 38 T-5 BLUE (SUTURE) ×6 IMPLANT
TISSUE GRAFT COLLECTOR (SYSTAGENIX WOUND MANAGEMENT) ×1 IMPLANT
TOWEL GREEN STERILE FF (TOWEL DISPOSABLE) ×2 IMPLANT
TUBE CONNECTING 20X1/4 (TUBING) ×1 IMPLANT
TUBE SUCTION HIGH CAP CLEAR NV (SUCTIONS) ×1 IMPLANT
TUBING ARTHROSCOPY IRRIG 16FT (MISCELLANEOUS) ×1 IMPLANT
WAND ABLATOR APOLLO I90 (BUR) ×1 IMPLANT

## 2024-02-25 NOTE — Progress Notes (Signed)
AssistedDr. Ellender with right, adductor canal, ultrasound guided block. Side rails up, monitors on throughout procedure. See vital signs in flow sheet. Tolerated Procedure well.  

## 2024-02-25 NOTE — Anesthesia Preprocedure Evaluation (Addendum)
 Anesthesia Evaluation  Patient identified by MRN, date of birth, ID band Patient awake    Reviewed: Allergy & Precautions, NPO status , Patient's Chart, lab work & pertinent test results  Airway Mallampati: II  TM Distance: >3 FB Neck ROM: Full    Dental no notable dental hx.    Pulmonary neg pulmonary ROS   Pulmonary exam normal        Cardiovascular negative cardio ROS Normal cardiovascular exam     Neuro/Psych negative neurological ROS  negative psych ROS   GI/Hepatic negative GI ROS, Neg liver ROS,,,  Endo/Other  negative endocrine ROS    Renal/GU negative Renal ROS     Musculoskeletal  (+) Arthritis ,    Abdominal   Peds  Hematology negative hematology ROS (+)   Anesthesia Other Findings Sprain of cruciate ligament of right knee  Reproductive/Obstetrics                             Anesthesia Physical Anesthesia Plan  ASA: 1  Anesthesia Plan: Regional and General   Post-op Pain Management:    Induction: Intravenous  PONV Risk Score and Plan: 2 and Ondansetron , Dexamethasone, Midazolam and Treatment may vary due to age or medical condition  Airway Management Planned: LMA  Additional Equipment:   Intra-op Plan:   Post-operative Plan: Extubation in OR  Informed Consent: I have reviewed the patients History and Physical, chart, labs and discussed the procedure including the risks, benefits and alternatives for the proposed anesthesia with the patient or authorized representative who has indicated his/her understanding and acceptance.     Dental advisory given  Plan Discussed with: CRNA and Surgeon  Anesthesia Plan Comments:        Anesthesia Quick Evaluation

## 2024-02-25 NOTE — Anesthesia Procedure Notes (Signed)
 Procedure Name: LMA Insertion Date/Time: 02/25/2024 10:28 AM  Performed by: Debarah Chiquita LABOR, CRNAPre-anesthesia Checklist: Patient identified, Emergency Drugs available, Suction available and Patient being monitored Patient Re-evaluated:Patient Re-evaluated prior to induction Oxygen Delivery Method: Circle system utilized Preoxygenation: Pre-oxygenation with 100% oxygen Induction Type: IV induction Ventilation: Mask ventilation without difficulty LMA: LMA inserted LMA Size: 4.0 Number of attempts: 1 Airway Equipment and Method: Bite block Placement Confirmation: positive ETCO2 Tube secured with: Tape Dental Injury: Teeth and Oropharynx as per pre-operative assessment

## 2024-02-25 NOTE — Transfer of Care (Signed)
 Immediate Anesthesia Transfer of Care Note  Patient: William Paul  Procedure(s) Performed: RECONSTRUCTION, KNEE, ACL (Right: Knee)  Patient Location: PACU  Anesthesia Type:GA combined with regional for post-op pain  Level of Consciousness: drowsy  Airway & Oxygen Therapy: Patient Spontanous Breathing and Patient connected to face mask oxygen  Post-op Assessment: Report given to RN and Post -op Vital signs reviewed and stable  Post vital signs: Reviewed and stable  Last Vitals:  Vitals Value Taken Time  BP 132/92 02/25/24 11:56  Temp 36.2 C 02/25/24 11:56  Pulse 80 02/25/24 11:59  Resp 8 02/25/24 11:59  SpO2 99 % 02/25/24 11:59  Vitals shown include unfiled device data.  Last Pain:  Vitals:   02/25/24 1156  TempSrc:   PainSc: Asleep      Patients Stated Pain Goal: 3 (02/25/24 0809)  Complications: No notable events documented.

## 2024-02-25 NOTE — Anesthesia Procedure Notes (Signed)
 Anesthesia Regional Block: Adductor canal block   Pre-Anesthetic Checklist: , timeout performed,  Correct Patient, Correct Site, Correct Laterality,  Correct Procedure,, site marked,  Risks and benefits discussed,  Surgical consent,  Pre-op evaluation,  At surgeon's request and post-op pain management  Laterality: Right  Prep: chloraprep       Needles:  Injection technique: Single-shot  Needle Type: Echogenic Stimulator Needle     Needle Length: 9cm  Needle Gauge: 21     Additional Needles:   Procedures:,,,, ultrasound used (permanent image in chart),,    Narrative:  Start time: 02/25/2024 9:00 AM End time: 02/25/2024 9:10 AM Injection made incrementally with aspirations every 5 mL.  Performed by: Personally  Anesthesiologist: Patrisha Bernardino SQUIBB, MD  Additional Notes: Functioning IV was confirmed and monitors were applied. A time-out was performed. Hand hygiene and sterile gloves were used. The thigh was placed in a frog-leg position and prepped in a sterile fashion. A 90mm 21ga Arrow echogenic stimulator needle was placed using ultrasound guidance.  Negative aspiration and negative test dose prior to incremental administration of local anesthetic. The patient tolerated the procedure well.

## 2024-02-25 NOTE — Interval H&P Note (Signed)
 All questions answered, patient wants to proceed with procedure. ? ?

## 2024-02-25 NOTE — Op Note (Signed)
 Orthopaedic Surgery Operative Note (CSN: [id number])  William Paul  05-10-2001 Date of Surgery: 02/25/2024   Diagnoses:  Right ACL rupture and medial meniscus tear  Procedure: Right ACL reconstruction with patellar autograft and medial meniscectomy   Operative Finding Exam under anesthesia: Unstable Lachman, full range of motion, varus valgus stability was normal Suprapatellar pouch: Normal Patellofemoral Compartment: Normal Medial Compartment: Small anterior horn medial meniscus tear debrided back to stable base, 5% meniscal volume resected Lateral Compartment: Normal Intercondylar Notch: Complete rupture of the ACL with scarring to the superior aspect of the notch and empty wall sign  Successful completion of the planned procedure.  Bone quality was somewhat soft and though there was minimal mismatch I backed up the tibial fixation with a swivel lock.  Rehab will be as is typical for BTB ACL reconstruction.  Post-operative plan: The patient will be weightbearing as tolerated.  The patient will be discharged home.  DVT prophylaxis Aspirin 81 mg twice daily for 6 weeks.  Pain control with PRN pain medication preferring oral medicines.  Follow up plan will be scheduled in approximately 7 days for incision check and XR.  Post-Op Diagnosis: Same Surgeons:Primary: Cristy Bonner DASEN, MD Assistants:Caroline McBane, PA-C Location: [facility] OR ROOM 1 Anesthesia: General with adductor canal Antibiotics: Ancef 2 g with local vancomycin powder 1 g at the surgical site Tourniquet time:  Total Tourniquet Time Documented: Thigh (Right) - 53 minutes Total: Thigh (Right) - 53 minutes  Estimated Blood Loss: Minimal Complications: None Specimens: None Implants: Implant Name Type Inv. Item Serial No. Manufacturer Lot No. LRB No. Used Action  SCREW SHEATHED INTERF 7X25 - [id number] Screw SCREW SHEATHED INTERF 7X25  [employer] [id number] Right 1 Implanted  SCREW SHEATHED INTERF 8X20MM - [id number] Screw  SCREW SHEATHED INTERF 8X20MM  [employer] [id number] Right 1 Implanted  IMPL SYS 2ND FX PEEK 4.75X19.1 - [id number] Miscellaneous IMPL SYS 2ND FX PEEK 4.75X19.1  [employer] [id number] Right 1 Implanted    Indications for Surgery:   William Paul is a 23 y.o. male with ACL rupture.  Benefits and risks of operative and nonoperative management were discussed prior to surgery with patient/guardian(s) and informed consent form was completed.  Specific risks including infection, need for additional surgery, rerupture, stiffness, arthrosis amongst others   Procedure:   The patient was identified properly. Informed consent was obtained and the surgical site was marked. The patient was taken up to suite where general anesthesia was induced. The patient was placed in the supine position with a post against the surgical leg and a nonsterile tourniquet applied. The surgical leg was then prepped and draped usual sterile fashion.  A standard surgical timeout was performed.  2 standard anterior portals were made and diagnostic arthroscopy performed. Please note the findings as noted above.  We identified a small anterior horn medial meniscus tear.  This was debrided back to stable base using a basket shaver.  We began by making an incision along the medial third of the patellar tendon in line with the tendon itself starting at the level of the distal pole of the patella ending 3 cm distal to the insertion on the tubercle. We carried the incision down sharply achieving hemostasis 3 progressed identifying the tissue plane of the peritenon. The created skin flaps medially and laterally taking care to avoid damage to the superficial skin. This point the peritenon was incised sharply in line with the tendon and again flaps are created exposing the medial and lateral borders of the tendon.  We then took care to ensure that there is appropriate visualization for forearm harvest within our incision using a mobile window  technique.  We then used a double bladed scalpel incised the tendon longitudinally 10 mm wide. This incision within the tendon was carried proximal and distally onto the tubercle and proximal wound patella to create a 25 mm bone block from patella and a 27 mm bone block from the tibia.  We made our longitudinal cuts with a saw taking care to not go deeper than 10 mm and rather than make a transverse patella cut we made a bullet style tapered cut at the proximal aspect of the patella to avoid the risk for transverse patella fracture.  The harvest went without issue and graft was taken to the back table.    This point we closed the defect in the patellar tendon after identifying that there was appropriate medial and lateral tendon still intact.   We began arthroscopy and made our lateral and medial portals within our incision on each side of the tendon. Fat pad was resected and diagnostic arthroscopy performed with the findings listed above.   The anterior cruciate ligament stump was debrided utilizing a shaver taking great care to preserve the remnant stump on the femur and the tibia for localization of our tunnels. Once the remnant anterior cruciate ligament was removed and we obtained appropriate visualization by performing a small notchplasty and confirmed that we had indeed identify the over-the-top position. We made small marks at the location of the aperture of the tibial and femoral tunnels and double checked our location prior to drilling.  We began with a femoral tunnel.  We had taken care to make a far medial and low anteromedial portal with spinal needle localization to ensure that we can get appropriate position on the lateral wall of the notch from an anterior medial portal drilling technique.  We used a 7 mm offset guide with the knee at 90 degrees of flexion to mark in the position of the old ACL stump.  We then switched our camera to the medial portal and checked that our position was  appropriate compared to the back wall.  At that point we hyperflexed the knee and used the 7 mm offset guide again primarily as a sheath to freehand place our wire at the aforementioned mark taking care to exit anterior to the mid femoral line to avoid posterior wall blowout.  Once the wire was advanced through the skin we clamped it and then placed a 10 mm acorn style reamer by hand ensuring that we did not interfere with the medial femoral condyle.  Once it entered the notch we are able to connect it to a reamer and ream to 34 mm of tunnel depth.  We used an [employer] GraftNet device to harvest with a shaver any of the bony debris and cleared bony debris from the tunnel to ensure that we had an easy pass.  We again checked from the medial portal to ensure that we only had a 2 mm posterior wall and appropriate position of our tunnel at the 10:30 position.  At this point we advanced our Beath pin and shuttled a #2 FiberWire for eventual graft passage.  We used a freehand technique to place a 2.4 guidewire starting the anterior medial tibia with the wire exiting at the tibial attachment of the ACL using the scope and the anterior horn of the lateral meniscus as guides.    We took great care  to ensure that our wire was positioned appropriately.  We then reamed with the barrel reamer through our ACL harvest incision taking care to protect the skin.  We harvested bone as we reamed and then completed the reaming into the joint.  Any soft tissue was cleared from the apertures of the tunnel.  We finally checked our tunnel position and apertures once more and were happy were these and proceeded with graft shuttling.  The graft was shuttled in typical fashion and we were able to visualize entering the femoral tunnel.  We ensured that the cancellous surface of the graft was anterior moving the collagen of the graft as far posterior as possible.  Before advancing the graft into the femoral socket we placed a  guidewire for screw fixation.  The graft was then advanced the appropriate depth and we hyperflexed the knee for placement of our screw.  Femoral fixation was with a 7 x 25 mm metal Ar[employer]rex screw. We obtained good purchase with the screw. We verified arthroscopically that there is no sign of graft impingement on the notch. We then cycled the knee multiple times and turned our attention to the tibia.  Tibia was fixed with a 8 x 20 mm metal Ar[employer]rex screw and the graft was extremely rotated 90 to anteriorize the tendinous portion within the joint. We achieved moderate purchase of the graft and there was minimal mismatch.  The tibial bone appeared to be relatively soft and we felt the backing of her fixation with a 4.75 peek swivel lock the anterior medial tibia would allow us  to be more aggressive with rehab.  At this point a gentle Lachman maneuver was performed and there is a stable endpoint and little translation.  Autograft harvested from left over graft prep as well as reamings was used to bone graft the patella as well as the tibial defects the peritenon was closed.  Incision was closed in multilayer fashion with absorbable suture and Steri-Strips placed. Sterile dressing and knee brace were placed and patient taken to PACU without adverse event.    Incisions closed with absorbable suture. The patient was awoken from general anesthesia and taken to the PACU in stable condition without complication.   Aleck Stalling, PA-C, present and scrubbed throughout the case, critical for completion in a timely fashion, and for retraction, instrumentation, closure.

## 2024-02-26 ENCOUNTER — Encounter (HOSPITAL_BASED_OUTPATIENT_CLINIC_OR_DEPARTMENT_OTHER): Payer: Self-pay | Admitting: Orthopaedic Surgery

## 2024-02-26 NOTE — Anesthesia Postprocedure Evaluation (Signed)
 Anesthesia Post Note  Patient: William Paul  Procedure(s) Performed: RECONSTRUCTION, KNEE, ACL (Right: Knee)     Patient location during evaluation: PACU Anesthesia Type: Regional and General Level of consciousness: awake Pain management: pain level controlled Vital Signs Assessment: post-procedure vital signs reviewed and stable Respiratory status: spontaneous breathing, nonlabored ventilation and respiratory function stable Cardiovascular status: blood pressure returned to baseline and stable Postop Assessment: no apparent nausea or vomiting Anesthetic complications: no   No notable events documented.  Last Vitals:  Vitals:   02/25/24 1230 02/25/24 1252  BP: (!) 136/91 126/88  Pulse: 63   Resp: 18 16  Temp:  (!) 36.2 C  SpO2: 95%     Last Pain:  Vitals:   02/25/24 1252  TempSrc:   PainSc: 6                  Bintou Lafata P Romy Ipock

## 2024-03-02 ENCOUNTER — Ambulatory Visit (HOSPITAL_COMMUNITY)
Admission: RE | Admit: 2024-03-02 | Discharge: 2024-03-02 | Disposition: A | Discharge status: Home / Self Care | Source: Ambulatory Visit | Attending: Vascular Surgery | Admitting: Vascular Surgery

## 2024-03-02 ENCOUNTER — Other Ambulatory Visit (HOSPITAL_COMMUNITY): Payer: Self-pay | Admitting: Orthopaedic Surgery

## 2024-03-02 DIAGNOSIS — I82401 Acute embolism and thrombosis of unspecified deep veins of right lower extremity: Secondary | ICD-10-CM

## 2024-03-02 DIAGNOSIS — M7989 Other specified soft tissue disorders: Secondary | ICD-10-CM

## 2024-03-02 DIAGNOSIS — M79604 Pain in right leg: Secondary | ICD-10-CM

## 2024-03-19 NOTE — ED Provider Notes (Signed)
  History of Present Illness   Chief Complaint from Triage Note: Chief Complaint  Patient presents with  . Dental Problem    Pt arrives reporting R sided dental pain x3 days.      HPI: William Paul is a 23 y.o. male who presents to the ED for right-sided upper dental pain.  He states been going on for 3 months but that it was killed whenever he was given antibiotics for his postoperative infections of the clavicle and knee a few months ago.  He states is now coming back in the last week.  States that hurts in his low but swollen.  Denies difficulty breathing or fevers.  Denies drainage.   History reviewed. No pertinent past medical history.  External Records Reviewed: None  Physical Exam    VITAL SIGNS: BP (!) 162/100 (BP Location: Right Upper Arm, Patient Position: Sitting)   Pulse 84   Temp 98.4 F (36.9 C) (Oral)   Resp 16   Ht 1.803 m (5' 11)   Wt 74.8 kg (165 lb)   SpO2 100%   BMI 23.01 kg/m  Constitutional:  Well developed, well nourished, no acute distress, non-toxic appearance.  HENT:  Normocephalic, atraumatic, oropharynx moist.  Scattered poor dentition.  The right upper molars have some gingival erythema without obvious fluctuance or drainage.  The uvula is midline. Neck:  Normal range of motion, supple, no stridor.  No cervical lymphadenopathy. Eyes:  conjunctiva normal, no discharge.  Respiratory:  Equal chest rise, no respiratory distress. Extremities:  Intact distal pulses, no edema. No major deformities noted.  Skin:  Warm, dry, no erythema, no rash.  Neurologic:  Awake and alert, gross normal cognition.  Moving all extremities. Psychiatric:  Affect normal, judgment normal, mood normal.  Medical Decision Making and ED Course   Patient is with dental pain.  See HPI for further.  EKG Interpretation (if applicable): ECG Results   None     ED Labs:     ED Radiology: No orders to display      ED Course/Disposition:     No obvious  deep space infections today.  No uvular deviation or obvious mass lesions within the oral cavity.  The neck is supple throughout without significant lymphadenopathy or tenderness or rigidity.  No signs of Ludwig's angina.  Thus I think it is reasonable to treat the patient with Augmentin for his likely dental infection.  Will give him a dose here and send the remainder to the outpatient pharmacy.  1 follow-up with his dentist, which she does have an established dentist.  Patient expresses understanding and agreement with the treatment plan.  Strict return precautions were provided.  Prescriptions Provided: Augmentin  Final Impression: Final diagnoses:  Dental infection    Procedures/CC Time: None         Robynn Modest, PA-C 03/19/24 2355

## 2024-03-21 ENCOUNTER — Other Ambulatory Visit: Payer: Self-pay

## 2024-03-21 ENCOUNTER — Emergency Department (HOSPITAL_BASED_OUTPATIENT_CLINIC_OR_DEPARTMENT_OTHER)
Admission: EM | Admit: 2024-03-21 | Discharge: 2024-03-21 | Disposition: A | Discharge status: Home / Self Care | Attending: Emergency Medicine | Admitting: Emergency Medicine

## 2024-03-21 DIAGNOSIS — K0889 Other specified disorders of teeth and supporting structures: Secondary | ICD-10-CM | POA: Diagnosis present

## 2024-03-21 DIAGNOSIS — K047 Periapical abscess without sinus: Secondary | ICD-10-CM | POA: Diagnosis not present

## 2024-03-21 DIAGNOSIS — Z7982 Long term (current) use of aspirin: Secondary | ICD-10-CM | POA: Diagnosis not present

## 2024-03-21 MED ORDER — CEFTRIAXONE SODIUM 1 G IJ SOLR
1.0000 g | Freq: Once | INTRAMUSCULAR | Status: AC
Start: 2024-03-21 — End: 2024-03-21
  Administered 2024-03-21: 1 g via INTRAMUSCULAR
  Filled 2024-03-21: qty 10

## 2024-03-21 MED ORDER — CHLORHEXIDINE GLUCONATE 0.12 % MT SOLN: 15.0000 mL | Freq: Two times a day (BID) | OROMUCOSAL | 0 refills | Status: DC

## 2024-03-21 NOTE — Discharge Instructions (Signed)
 It was a pleasure taking care of you here today  Continue taking the clindamycin antibiotics at home.  I have added an an antibiotic mouthwash.  Continue to follow-up with the dentist at your appointment tomorrow.  Warm compress to the face.  It is encouraging that there was drainage as we want this area to continue draining.  Return for any new or worsening symptoms.

## 2024-03-21 NOTE — ED Provider Notes (Signed)
  EMERGENCY DEPARTMENT AT [facility] Provider Note   CSN: [id number] Arrival date & time: 03/21/24  1753     Patient presents with: Dental Pain   William Paul is a 23 y.o. male here for evaluation of dental pain.  Started 5 day ago.  Seen [facility] at that time.  Started on clindamycin.  He comes in today due to persistent dental pain.  He has an appoint with a Patent examiner.  Has some swelling over his right zygomatic.  Does not extend into periorbital area.  No overlying erythema or or warmth to skin.  States area had popped earlier today and he is feeling better however was told that he could possibly need IV antibiotics.  No fever.   HPI     Prior to Admission medications   Medication Sig Start Date End Date Taking? Authorizing Provider  chlorhexidine  (PERIDEX ) 0.12 % solution Use as directed 15 mLs in the mouth or throat 2 (two) times daily. 03/21/24  Yes Lucian Baswell A, PA-C  aspirin  (ASPIRIN  CHILDRENS) 81 MG chewable tablet Chew 1 tablet (81 mg total) by mouth 2 (two) times daily. For 6 weeks for DVT prophylaxis after surgery 02/25/24 04/07/24  McBane, Caroline N, PA-C  meloxicam  (MOBIC ) 15 MG tablet Take 1 tablet (15 mg total) by mouth daily. For 2 weeks for pain and inflammation. Then take as needed 02/25/24   McBane, Caroline N, PA-C  methocarbamol (ROBAXIN) 750 MG tablet Take 750 mg by mouth every 6 (six) hours as needed for muscle spasms. 12/22/23   [provider]    Allergies: Oseltamivir and Amoxicillin    Review of Systems  Constitutional: Negative.   HENT:  Positive for dental problem and facial swelling.   All other systems reviewed and are negative.   Updated Vital Signs BP (!) 151/105 (BP Location: Right Arm)   Pulse 83   Temp 98.8 F (37.1 C)   Resp 18   SpO2 98%   Physical Exam Vitals and nursing note reviewed.  Constitutional:      General: He is not in acute distress.    Appearance: He is well-developed. He  is not ill-appearing, toxic-appearing or diaphoretic.  HENT:     Head: Normocephalic and atraumatic.     Comments: No drooling, dysphagia or trismus.  No fluctuance, induration, erythema.  Mild right zygomatic fullness    Nose: Nose normal.     Mouth/Throat:     Lips: Pink.     Mouth: Mucous membranes are moist.     Dentition: Abnormal dentition. Dental tenderness, gingival swelling and dental abscesses present.     Pharynx: Oropharynx is clear. Uvula midline. No pharyngeal swelling, oropharyngeal exudate or posterior oropharyngeal erythema.     Tonsils: No tonsillar exudate or tonsillar abscesses.      Comments: Right upper gingival erythema with periapical abscess that appears ruptured.  Gingival erythema, fullness right upper buccal fold.  Eyes:     Pupils: Pupils are equal, round, and reactive to light.  Neck:     Trachea: Trachea and phonation normal.  Cardiovascular:     Rate and Rhythm: Normal rate and regular rhythm.  Pulmonary:     Effort: Pulmonary effort is normal. No respiratory distress.  Abdominal:     General: There is no distension.     Palpations: Abdomen is soft.  Musculoskeletal:        General: Normal range of motion.     Cervical back: Full passive range of  motion without pain, normal range of motion and neck supple.  Lymphadenopathy:     Cervical: No cervical adenopathy.  Skin:    General: Skin is warm and dry.  Neurological:     General: No focal deficit present.     Mental Status: He is alert and oriented to person, place, and time.     (all labs ordered are listed, but only abnormal results are displayed) Labs Reviewed - No data to display  EKG: None  Radiology: No results found.   Procedures   Medications Ordered in the ED  cefTRIAXone  (ROCEPHIN ) injection 1 g (1 g Intramuscular Given 03/21/24 6771)   23 year old here for evaluation of dental infection.  Has had recurrent dental abscess to right upper dentition.  Seen by [facility] 2 days ago  started on clindamycin.  Still some pain.  Reports that area ruptured earlier today.  He is afebrile, nonseptic, non-ill-appearing.  He appears to have had a right periapical abscess he does some fullness to his right buccal fold.  I offered bedside I&D.  He has appointment with dentistry tomorrow.  He declines incision and drainage here.  He has no obvious cellulitis to his face.  No fluctuance, induration, erythema or warmth.  Does have some mild soft tissue fullness over his right zygomatic.  Denies any traumatic injury.  No periorbital erythema to suggest preorbital/orbital cellulitis.  Low suspicion for deep space infection, Ludwig's angina, dural sinus thrombosis, sepsis, failed outpatient treatment of antibiotics.  Give him IM Rocephin .  Discussed warm compress to face.  He will keep his appointment with dentistry tomorrow.  Added on Peridex  mouth solution.  Return for any worsening symptoms.  Do not think he needs additional labs or imaging at this time.  The patient has been appropriately medically screened and/or stabilized in the ED. I have low suspicion for any other emergent medical condition which would require further screening, evaluation or treatment in the ED or require inpatient management.  Patient is hemodynamically stable and in no acute distress.  Patient able to ambulate in department prior to ED.  Evaluation does not show acute pathology that would require ongoing or additional emergent interventions while in the emergency department or further inpatient treatment.  I have discussed the diagnosis with the patient and answered all questions.  Pain is been managed while in the emergency department and patient has no further complaints prior to discharge.  Patient is comfortable with plan discussed in room and is stable for discharge at this time.  I have discussed strict return precautions for returning to the emergency department.  Patient was encouraged to follow-up with PCP/specialist  refer to at discharge.                                  Medical Decision Making Amount and/or Complexity of Data Reviewed Independent Historian: parent External Data Reviewed: labs, radiology and notes.  Risk OTC drugs. Prescription drug management. Decision regarding hospitalization. Diagnosis or treatment significantly limited by social determinants of health.        Final diagnoses:  Dental infection    ED Discharge Orders          Ordered    chlorhexidine  (PERIDEX ) 0.12 % solution  2 times daily        03/21/24 1821               Jaisa Defino A, PA-C 03/21/24 1832  Jerrol Agent, MD 03/21/24 TYRA

## 2024-03-21 NOTE — ED Triage Notes (Signed)
 States has an recurrent dental abscess. Recently on antibiotics and has appt tomorrow.   Requesting IV antibiotics.

## 2024-03-22 NOTE — Transitions of Care (Post Inpatient/ED Visit) (Signed)
   03/22/2024  Name: William Paul MRN: [id number] DOB: 11/21/2000  Today's TOC FU Call Status: Today's TOC FU Call Status:: Successful TOC FU Call Completed TOC FU Call Complete Date: 03/22/24 Patient's Name and Date of Birth confirmed.  Transition Care Management Follow-up Telephone Call Date of Discharge: 03/21/24 Discharge Facility: [facility] Type of Discharge: Emergency Department Reason for ED Visit: Other: How have you been since you were released from the hospital?: Better Any questions or concerns?: No  Items Reviewed: Did you receive and understand the discharge instructions provided?: Yes Medications obtained,verified, and reconciled?: Yes (Medications Reviewed) Any new allergies since your discharge?: No Dietary orders reviewed?: NA Do you have support at home?: Yes  Medications Reviewed Today: Medications Reviewed Today     Reviewed by Claudene Shanda ORN, CMA (Certified Medical Assistant) on 03/22/24 at 0915  Med List Status: <None>   Medication Order Taking? Sig Documenting Provider Last Dose Status Informant  aspirin  (ASPIRIN  CHILDRENS) 81 MG chewable tablet [id number]  Chew 1 tablet (81 mg total) by mouth 2 (two) times daily. For 6 weeks for DVT prophylaxis after surgery McBane, Aleck SAILOR, PA-C  Active   chlorhexidine  (PERIDEX ) 0.12 % solution [id number]  Use as directed 15 mLs in the mouth or throat 2 (two) times daily. Henderly, Britni A, PA-C  Active   meloxicam  (MOBIC ) 15 MG tablet [id number]  Take 1 tablet (15 mg total) by mouth daily. For 2 weeks for pain and inflammation. Then take as needed McBane, Caroline N, PA-C  Active   methocarbamol (ROBAXIN) 750 MG tablet [id number] No Take 750 mg by mouth every 6 (six) hours as needed for muscle spasms. [provider] Past Month Active             Home Care and Equipment/Supplies: Were Home Health Services Ordered?: NA Any new equipment or medical supplies ordered?: NA  Functional  Questionnaire: Do you need assistance with bathing/showering or dressing?: No Do you need assistance with meal preparation?: No Do you need assistance with eating?: No Do you have difficulty maintaining continence: No Do you need assistance with getting out of bed/getting out of a chair/moving?: No Do you have difficulty managing or taking your medications?: No  Follow up appointments reviewed: PCP Follow-up appointment confirmed?: No MD Provider Line Number:[phone] Given: Yes Specialist Hospital Follow-up appointment confirmed?: NA Do you need transportation to your follow-up appointment?: No Do you understand care options if your condition(s) worsen?: Yes-patient verbalized understanding    SIGNATURE Shanda Claudene

## 2024-03-24 ENCOUNTER — Encounter: Payer: Self-pay | Admitting: Family Medicine

## 2024-03-24 ENCOUNTER — Ambulatory Visit (INDEPENDENT_AMBULATORY_CARE_PROVIDER_SITE_OTHER): Admitting: Family Medicine

## 2024-03-24 VITALS — BP 138/79 | HR 60 | Temp 98.7°F | Resp 14 | Ht 71.0 in | Wt 160.2 lb

## 2024-03-24 DIAGNOSIS — R509 Fever, unspecified: Secondary | ICD-10-CM

## 2024-03-24 DIAGNOSIS — B882 Other arthropod infestations: Secondary | ICD-10-CM
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
## 2024-03-29 ENCOUNTER — Ambulatory Visit: Payer: Self-pay | Admitting: Family Medicine

## 2024-03-29 LAB — SPOTTED FEVER GROUP ANTIBODIES
Spotted Fever Group IgG: <1:64 {titer}
Spotted Fever Group IgM: <1:64 {titer}

## 2024-03-29 LAB — LYME DISEASE SEROLOGY W/REFLEX: Lyme Total Antibody EIA: NEGATIVE

## 2024-04-04 ENCOUNTER — Other Ambulatory Visit (HOSPITAL_COMMUNITY): Payer: Self-pay

## 2024-04-04 ENCOUNTER — Ambulatory Visit (INDEPENDENT_AMBULATORY_CARE_PROVIDER_SITE_OTHER): Admitting: Family Medicine

## 2024-04-04 ENCOUNTER — Telehealth: Payer: Self-pay

## 2024-04-04 ENCOUNTER — Encounter: Payer: Self-pay | Admitting: Family Medicine

## 2024-04-04 VITALS — BP 122/76 | HR 95 | Temp 98.3°F | Ht 71.0 in | Wt 156.4 lb

## 2024-04-04 DIAGNOSIS — F900 Attention-deficit hyperactivity disorder, predominantly inattentive type: Secondary | ICD-10-CM | POA: Diagnosis not present

## 2024-04-04 MED ORDER — LISDEXAMFETAMINE DIMESYLATE 20 MG PO CAPS: 20.0000 mg | ORAL_CAPSULE | Freq: Every day | ORAL | 0 refills | Status: DC
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
## 2024-04-04 NOTE — Telephone Encounter (Signed)
 Pharmacy Patient Advocate Encounter   Received notification from CoverMyMeds that prior authorization for Lisdexamfetamine Dimesylate  20MG  capsules  is required/requested.   Insurance verification completed.   The patient is insured through [facility] .   Per test claim: PA required; PA submitted to above mentioned insurance via CoverMyMeds Key/confirmation #/EOC [id number] Status is pending

## 2024-04-04 NOTE — Telephone Encounter (Signed)
 Pt advised PA approved via phone call.

## 2024-04-04 NOTE — Telephone Encounter (Signed)
 Pharmacy Patient Advocate Encounter  Received notification from [facility] that Prior Authorization for Lisdexamfetamine Dimesylate  20MG  capsules  has been APPROVED from 04/04/2024 to 04/04/2027. Ran test claim, Copay is $0.00. This test claim was processed through [facility]- copay amounts may vary at other pharmacies due to pharmacy/plan contracts, or as the patient moves through the different stages of their insurance plan.   PA Case ID #: [id number]

## 2024-05-04 ENCOUNTER — Telehealth: Admitting: Family Medicine

## 2024-05-04 ENCOUNTER — Encounter: Payer: Self-pay | Admitting: Family Medicine

## 2024-05-04 NOTE — Progress Notes (Signed)
 Patient not seen.

## 2024-05-10 ENCOUNTER — Other Ambulatory Visit: Payer: Self-pay | Admitting: Family Medicine

## 2024-05-10 NOTE — Telephone Encounter (Unsigned)
 Copied from CRM [id number]. Topic: Clinical - Medication Refill >> May 10, 2024 12:46 PM Suzen RAMAN wrote: Medication: lisdexamfetamine (VYVANSE ) 20 MG capsule   Has the patient contacted their pharmacy? Yes   This is the patient's preferred pharmacy:  [facility] [place] [facility] [address] [place] [address] Phone: [phone] Fax: [phone]  Is this the correct pharmacy for this prescription? Yes If no, delete pharmacy and type the correct one.   Has the prescription been filled recently? No  Is the patient out of the medication? No  Has the patient been seen for an appointment in the last year OR does the patient have an upcoming appointment? Yes  Can we respond through MyChart? Yes  Agent: Please be advised that Rx refills may take up to 3 business days. We ask that you follow-up with your pharmacy.

## 2024-05-11 MED ORDER — LISDEXAMFETAMINE DIMESYLATE 20 MG PO CAPS: 20.0000 mg | ORAL_CAPSULE | Freq: Every day | ORAL | 0 refills | Status: DC

## 2024-05-11 NOTE — Telephone Encounter (Signed)
 LVM for pt to return call. He is due for follow up, appt was originally scheduled for 9/3 but  was not completed. Please schedule for appt

## 2024-05-11 NOTE — Telephone Encounter (Signed)
 Pt scheduled for virtual appt 9/17.   Please fill, if appropriate.

## 2024-05-18 ENCOUNTER — Encounter: Payer: Self-pay | Admitting: Family Medicine

## 2024-05-18 ENCOUNTER — Telehealth (INDEPENDENT_AMBULATORY_CARE_PROVIDER_SITE_OTHER): Admitting: Family Medicine

## 2024-05-18 VITALS — Wt 165.0 lb

## 2024-05-18 DIAGNOSIS — F900 Attention-deficit hyperactivity disorder, predominantly inattentive type: Secondary | ICD-10-CM

## 2024-05-18 MED ORDER — LISDEXAMFETAMINE DIMESYLATE 20 MG PO CAPS: 20.0000 mg | ORAL_CAPSULE | Freq: Every day | ORAL | 0 refills | Status: DC
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
## 2024-05-27 ENCOUNTER — Other Ambulatory Visit (HOSPITAL_BASED_OUTPATIENT_CLINIC_OR_DEPARTMENT_OTHER): Payer: Self-pay

## 2024-05-31 ENCOUNTER — Other Ambulatory Visit: Payer: Self-pay | Admitting: Family Medicine

## 2024-05-31 NOTE — Telephone Encounter (Unsigned)
 Copied from CRM #[id number]. Topic: Clinical - Medication Refill >> May 31, 2024  2:22 PM Carmen B wrote: Medication: lisdexamfetamine (VYVANSE ) 20 MG capsule  Has the patient contacted their pharmacy? No, calls office for refills   This is the patient's preferred pharmacy:   [id number] [place] [place] - [address] [place] [address] Phone: [phone] Fax: [phone]  Is this the correct pharmacy for this prescription? Yes  Has the prescription been filled recently? No  Is the patient out of the medication? No, two left  Has the patient been seen for an appointment in the last year OR does the patient have an upcoming appointment? Yes  Can we respond through MyChart? Yes  Agent: Please be advised that Rx refills may take up to 3 business days. We ask that you follow-up with your pharmacy.

## 2024-07-18 ENCOUNTER — Other Ambulatory Visit: Payer: Self-pay | Admitting: Family Medicine

## 2024-07-18 NOTE — Telephone Encounter (Unsigned)
 Copied from CRM #[id number]. Topic: Clinical - Medication Refill >> Jul 18, 2024  1:24 PM Shereese L wrote: Medication: lisdexamfetamine (VYVANSE ) 20 MG capsule  Has the patient contacted their pharmacy? Yes (Agent: If no, request that the patient contact the pharmacy for the refill. If patient does not wish to contact the pharmacy document the reason why and proceed with request.) (Agent: If yes, when and what did the pharmacy advise?)  This is the patient's preferred pharmacy:  [id number] [place], [place] - [address] [place] [address] Phone: [phone] Fax: [phone]   Is this the correct pharmacy for this prescription? Yes If no, delete pharmacy and type the correct one.   Has the prescription been filled recently? Yes  Is the patient out of the medication? Yes  Has the patient been seen for an appointment in the last year OR does the patient have an upcoming appointment? Yes  Can we respond through MyChart? Yes  Agent: Please be advised that Rx refills may take up to 3 business days. We ask that you follow-up with your pharmacy.

## 2024-07-19 MED ORDER — LISDEXAMFETAMINE DIMESYLATE 20 MG PO CAPS: 20.0000 mg | ORAL_CAPSULE | Freq: Every day | ORAL | 0 refills | Status: DC

## 2024-07-19 NOTE — Telephone Encounter (Signed)
 Requesting: Vyvanse  Contract: N/A UDS: N/A Last Visit: 05/18/24 Next Visit: advised to f/u 6 months Last Refill: 05/18/24 (30,0)  Please Advise. Rx pending

## 2024-07-20 NOTE — Telephone Encounter (Signed)
Pt advised refill sent. °

## 2024-08-29 ENCOUNTER — Other Ambulatory Visit: Payer: Self-pay | Admitting: Family Medicine

## 2024-08-29 MED ORDER — LISDEXAMFETAMINE DIMESYLATE 20 MG PO CAPS: 20.0000 mg | ORAL_CAPSULE | Freq: Every day | ORAL | 0 refills | Status: AC

## 2024-08-29 NOTE — Telephone Encounter (Signed)
 Copied from CRM #[id number]. Topic: Clinical - Medication Refill >> Aug 29, 2024 12:03 PM Alexandria E wrote: Medication: lisdexamfetamine  (VYVANSE ) 20 MG capsule  Has the patient contacted their pharmacy? No (Agent: If no, request that the patient contact the pharmacy for the refill. If patient does not wish to contact the pharmacy document the reason why and proceed with request.) (Agent: If yes, when and what did the pharmacy advise?)  This is the patient's preferred pharmacy:  [id number] [place] [facility], [place] - [address] AT [facility] [place] [address] Phone: [phone] Fax: [phone]  Is this the correct pharmacy for this prescription? Yes If no, delete pharmacy and type the correct one.   Has the prescription been filled recently? No  Is the patient out of the medication? Yes  Has the patient been seen for an appointment in the last year OR does the patient have an upcoming appointment? Yes  Can we respond through MyChart? Yes  Agent: Please be advised that Rx refills may take up to 3 business days. We ask that you follow-up with your pharmacy.

## 2024-08-29 NOTE — Telephone Encounter (Signed)
 Requesting: Vyvanse  Contract: N/A UDS: N/A Last Visit: 05/18/24 Next Visit: advised to f/u 6 mo; not scheduled Last Refill: 07/19/24 (30,0)  Please Advise. Rx pending
